# Patient Record
Sex: Female | Born: 1975 | Race: White | Hispanic: No | Marital: Married | Smoking: Never smoker
Health system: Southern US, Community
[De-identification: ages and names within clinical notes are randomized; demographics above are authoritative.]

## PROBLEM LIST (undated history)

## (undated) DIAGNOSIS — F32A Depression, unspecified: Secondary | ICD-10-CM

## (undated) DIAGNOSIS — B948 Sequelae of other specified infectious and parasitic diseases: Secondary | ICD-10-CM

## (undated) DIAGNOSIS — N189 Chronic kidney disease, unspecified: Secondary | ICD-10-CM

## (undated) DIAGNOSIS — G629 Polyneuropathy, unspecified: Secondary | ICD-10-CM

## (undated) DIAGNOSIS — G8929 Other chronic pain: Secondary | ICD-10-CM

## (undated) DIAGNOSIS — T8859XA Other complications of anesthesia, initial encounter: Secondary | ICD-10-CM

## (undated) DIAGNOSIS — F329 Major depressive disorder, single episode, unspecified: Secondary | ICD-10-CM

## (undated) DIAGNOSIS — R51 Headache: Secondary | ICD-10-CM

## (undated) DIAGNOSIS — T4145XA Adverse effect of unspecified anesthetic, initial encounter: Secondary | ICD-10-CM

## (undated) DIAGNOSIS — F419 Anxiety disorder, unspecified: Secondary | ICD-10-CM

## (undated) DIAGNOSIS — K5900 Constipation, unspecified: Secondary | ICD-10-CM

## (undated) DIAGNOSIS — R519 Headache, unspecified: Secondary | ICD-10-CM

## (undated) DIAGNOSIS — K219 Gastro-esophageal reflux disease without esophagitis: Secondary | ICD-10-CM

## (undated) DIAGNOSIS — M1990 Unspecified osteoarthritis, unspecified site: Secondary | ICD-10-CM

## (undated) DIAGNOSIS — K861 Other chronic pancreatitis: Secondary | ICD-10-CM

## (undated) DIAGNOSIS — N200 Calculus of kidney: Secondary | ICD-10-CM

## (undated) DIAGNOSIS — A449 Bartonellosis, unspecified: Secondary | ICD-10-CM

## (undated) DIAGNOSIS — N12 Tubulo-interstitial nephritis, not specified as acute or chronic: Secondary | ICD-10-CM

## (undated) DIAGNOSIS — I10 Essential (primary) hypertension: Secondary | ICD-10-CM

## (undated) DIAGNOSIS — K623 Rectal prolapse: Secondary | ICD-10-CM

## (undated) HISTORY — DX: Unspecified osteoarthritis, unspecified site: M19.90

## (undated) HISTORY — DX: Constipation, unspecified: K59.00

## (undated) HISTORY — DX: Chronic kidney disease, unspecified: N18.9

## (undated) HISTORY — DX: Bartonellosis, unspecified: A44.9

## (undated) HISTORY — DX: Sequelae of other specified infectious and parasitic diseases: B94.8

## (undated) HISTORY — DX: Depression, unspecified: F32.A

## (undated) HISTORY — DX: Calculus of kidney: N20.0

## (undated) HISTORY — DX: Major depressive disorder, single episode, unspecified: F32.9

## (undated) HISTORY — DX: Polyneuropathy, unspecified: G62.9

## (undated) HISTORY — DX: Gastro-esophageal reflux disease without esophagitis: K21.9

## (undated) HISTORY — DX: Rectal prolapse: K62.3

## (undated) HISTORY — DX: Other chronic pain: G89.29

## (undated) HISTORY — DX: Anxiety disorder, unspecified: F41.9

## (undated) HISTORY — DX: Tubulo-interstitial nephritis, not specified as acute or chronic: N12

## (undated) HISTORY — DX: Other chronic pancreatitis: K86.1

---

## 1996-11-05 HISTORY — PX: COLONOSCOPY: SHX174

## 1996-11-05 HISTORY — PX: KNEE SURGERY: SHX244

## 2003-11-06 HISTORY — PX: ESOPHAGOGASTRODUODENOSCOPY ENDOSCOPY: SHX5814

## 2003-11-06 HISTORY — PX: CHOLECYSTECTOMY: SHX55

## 2003-11-06 HISTORY — PX: PORTACATH PLACEMENT: SHX2246

## 2005-11-05 HISTORY — PX: CYSTOSCOPY W/ SPINCTEROTOMY: SUR378

## 2006-05-18 DIAGNOSIS — K834 Spasm of sphincter of Oddi: Secondary | ICD-10-CM | POA: Insufficient documentation

## 2007-11-06 HISTORY — PX: SPINAL CORD STIMULATOR INSERTION: SHX5378

## 2011-04-18 ENCOUNTER — Ambulatory Visit: Payer: Self-pay | Admitting: Family Medicine

## 2011-11-08 DIAGNOSIS — F32A Depression, unspecified: Secondary | ICD-10-CM | POA: Insufficient documentation

## 2011-11-08 DIAGNOSIS — F419 Anxiety disorder, unspecified: Secondary | ICD-10-CM | POA: Insufficient documentation

## 2011-11-08 DIAGNOSIS — F329 Major depressive disorder, single episode, unspecified: Secondary | ICD-10-CM | POA: Insufficient documentation

## 2012-01-07 DIAGNOSIS — A6920 Lyme disease, unspecified: Secondary | ICD-10-CM | POA: Insufficient documentation

## 2012-01-07 DIAGNOSIS — E559 Vitamin D deficiency, unspecified: Secondary | ICD-10-CM | POA: Insufficient documentation

## 2012-12-26 ENCOUNTER — Emergency Department: Payer: Self-pay | Admitting: Unknown Physician Specialty

## 2012-12-27 LAB — CK TOTAL AND CKMB (NOT AT ARMC)
CK, Total: 49 U/L (ref 21–215)
CK-MB: <0.5 ng/mL — ABNORMAL LOW (ref 0.5–3.6)

## 2012-12-27 LAB — TROPONIN I: Troponin-I: <0.02 ng/mL

## 2012-12-27 LAB — CBC
HCT: 45.2 % (ref 35.0–47.0)
HGB: 15.1 g/dL (ref 12.0–16.0)
MCH: 27.1 pg (ref 26.0–34.0)
MCHC: 33.3 g/dL (ref 32.0–36.0)
MCV: 81 fL (ref 80–100)
RDW: 13.3 % (ref 11.5–14.5)

## 2012-12-27 LAB — COMPREHENSIVE METABOLIC PANEL
Albumin: 3.5 g/dL (ref 3.4–5.0)
Anion Gap: 13 (ref 7–16)
Calcium, Total: 8.6 mg/dL (ref 8.5–10.1)
EGFR (Non-African Amer.): >60
Osmolality: 267 (ref 275–301)
SGOT(AST): 86 U/L — ABNORMAL HIGH (ref 15–37)
SGPT (ALT): 84 U/L — ABNORMAL HIGH (ref 12–78)
Sodium: 134 mmol/L — ABNORMAL LOW (ref 136–145)

## 2012-12-29 ENCOUNTER — Emergency Department: Payer: Self-pay | Admitting: Emergency Medicine

## 2012-12-29 LAB — CBC
HGB: 15.1 g/dL (ref 12.0–16.0)
MCHC: 33.8 g/dL (ref 32.0–36.0)
MCV: 82 fL (ref 80–100)
RDW: 13.2 % (ref 11.5–14.5)
WBC: 8.8 10*3/uL (ref 3.6–11.0)

## 2012-12-29 LAB — COMPREHENSIVE METABOLIC PANEL
Alkaline Phosphatase: 126 U/L (ref 50–136)
Anion Gap: 7 (ref 7–16)
Bilirubin,Total: 0.4 mg/dL (ref 0.2–1.0)
Creatinine: 0.71 mg/dL (ref 0.60–1.30)
EGFR (African American): >60
Osmolality: 276 (ref 275–301)
Potassium: 3.8 mmol/L (ref 3.5–5.1)
SGOT(AST): 53 U/L — ABNORMAL HIGH (ref 15–37)
SGPT (ALT): 116 U/L — ABNORMAL HIGH (ref 12–78)
Sodium: 139 mmol/L (ref 136–145)

## 2012-12-30 LAB — URINALYSIS, COMPLETE
Bilirubin,UR: NEGATIVE
Blood: NEGATIVE
Ketone: NEGATIVE
Ph: 7 (ref 4.5–8.0)
Protein: NEGATIVE
RBC,UR: 4 /HPF (ref 0–5)
Specific Gravity: 1.008 (ref 1.003–1.030)
WBC UR: 1 /HPF (ref 0–5)

## 2013-03-12 DIAGNOSIS — IMO0002 Reserved for concepts with insufficient information to code with codable children: Secondary | ICD-10-CM | POA: Insufficient documentation

## 2013-10-20 DIAGNOSIS — M2550 Pain in unspecified joint: Secondary | ICD-10-CM | POA: Insufficient documentation

## 2014-05-28 DIAGNOSIS — B949 Sequelae of unspecified infectious and parasitic disease: Secondary | ICD-10-CM | POA: Insufficient documentation

## 2014-05-28 DIAGNOSIS — A449 Bartonellosis, unspecified: Secondary | ICD-10-CM | POA: Insufficient documentation

## 2014-05-28 DIAGNOSIS — Z87440 Personal history of urinary (tract) infections: Secondary | ICD-10-CM | POA: Insufficient documentation

## 2014-05-28 DIAGNOSIS — G609 Hereditary and idiopathic neuropathy, unspecified: Secondary | ICD-10-CM | POA: Insufficient documentation

## 2014-07-14 DIAGNOSIS — R1013 Epigastric pain: Secondary | ICD-10-CM | POA: Insufficient documentation

## 2016-03-14 ENCOUNTER — Other Ambulatory Visit: Payer: Self-pay

## 2016-03-14 DIAGNOSIS — K623 Rectal prolapse: Secondary | ICD-10-CM | POA: Insufficient documentation

## 2016-03-14 DIAGNOSIS — B948 Sequelae of other specified infectious and parasitic diseases: Secondary | ICD-10-CM | POA: Insufficient documentation

## 2016-03-14 DIAGNOSIS — M8909 Algoneurodystrophy, multiple sites: Secondary | ICD-10-CM | POA: Insufficient documentation

## 2016-03-14 DIAGNOSIS — K5909 Other constipation: Secondary | ICD-10-CM | POA: Insufficient documentation

## 2016-03-14 DIAGNOSIS — N200 Calculus of kidney: Secondary | ICD-10-CM | POA: Insufficient documentation

## 2016-03-14 DIAGNOSIS — K861 Other chronic pancreatitis: Secondary | ICD-10-CM | POA: Insufficient documentation

## 2016-03-14 DIAGNOSIS — M069 Rheumatoid arthritis, unspecified: Secondary | ICD-10-CM | POA: Insufficient documentation

## 2016-03-14 DIAGNOSIS — G5640 Causalgia of unspecified upper limb: Secondary | ICD-10-CM | POA: Insufficient documentation

## 2016-03-14 DIAGNOSIS — G8929 Other chronic pain: Secondary | ICD-10-CM | POA: Insufficient documentation

## 2016-03-14 DIAGNOSIS — Z87440 Personal history of urinary (tract) infections: Secondary | ICD-10-CM | POA: Insufficient documentation

## 2016-03-14 DIAGNOSIS — G629 Polyneuropathy, unspecified: Secondary | ICD-10-CM | POA: Insufficient documentation

## 2016-03-15 ENCOUNTER — Encounter: Payer: Self-pay | Admitting: Gastroenterology

## 2016-03-15 ENCOUNTER — Ambulatory Visit (INDEPENDENT_AMBULATORY_CARE_PROVIDER_SITE_OTHER): Payer: BLUE CROSS/BLUE SHIELD | Admitting: Gastroenterology

## 2016-03-15 VITALS — BP 155/99 | HR 112 | Temp 98.7°F | Ht 67.0 in | Wt 157.0 lb

## 2016-03-15 DIAGNOSIS — K5909 Other constipation: Secondary | ICD-10-CM

## 2016-03-15 DIAGNOSIS — R140 Abdominal distension (gaseous): Secondary | ICD-10-CM | POA: Diagnosis not present

## 2016-03-15 DIAGNOSIS — G43A0 Cyclical vomiting, not intractable: Secondary | ICD-10-CM

## 2016-03-15 DIAGNOSIS — R1115 Cyclical vomiting syndrome unrelated to migraine: Secondary | ICD-10-CM

## 2016-03-15 NOTE — Progress Notes (Signed)
Gastroenterology Consultation  Referring Provider:     No ref. provider found Primary Care Physician:  No PCP Per Patient Primary Gastroenterologist:  Dr. Servando Snare     Reason for Consultation:     Abdominal pain and constipation        HPI:   Tina Donovan is a 40 y.o. y/o female referred for consultation & management of  Abdominal pain and constipation by Dr. Bonnetta Barry PCP Per Patient.   This patient comes today with a history of constipation. The patient also has an extensive past medical history including RDS and sphincter of Oddi dysfunction. The patient has been seen and treated at the [facility] in [place], [facility] in [place] and she has been seen in [place] at [facility]. The patient now comes to me because she states that she was told at [facility] that all that could be done for her constipation was to increase her Miralax. The patient also reports that she has recurrent hiccups. She also has chronic nausea. The patient takes chronic pain medications. In addition to her constipation the patient reports that she has a lot of abdominal bloating. She also has had a rectoceal in the past and she thinks it has returned. The patient also has rectal bleeding when she has her rectal prolapse.  Past Medical History  Diagnosis Date  . GERD (gastroesophageal reflux disease)   . Anxiety   . Depression   . Chronic kidney disease   . Post-Lyme disease syndrome   . Bartonella infection   . Small fiber neuropathy (HCC)   . Chronic pancreatitis (HCC)   . Arthritis     Rheumatoid  . Chronic pain   . Rectal prolapse   . Constipation   . Kidney stone   . Pyelonephritis     Past Surgical History  Procedure Laterality Date  . Knee surgery  1998  . Cholecystectomy  2005    [facility]  . Cystoscopy w/ spincterotomy  2007  . Portacath placement  2005  . Spinal cord stimulator insertion  2009  . Colonoscopy  1998  . Esophagogastroduodenoscopy endoscopy  2005    2007, 2009 and 2016    Prior  to Admission medications   Medication Sig Start Date End Date Taking? Authorizing Provider  ALPRAZolam Prudy Feeler) 0.5 MG tablet  08/06/15  Yes Historical Provider, MD  ALPRAZolam Prudy Feeler) 1 MG tablet 1 tablet. 03/11/16  Yes Historical Provider, MD  aspirin 325 MG tablet Take by mouth.   Yes Historical Provider, MD  aspirin-acetaminophen-caffeine (EXCEDRIN MIGRAINE) [phone] MG tablet Take by mouth.   Yes Historical Provider, MD  diazepam (VALIUM) 5 MG tablet Take 5 mg by mouth.   Yes Historical Provider, MD  HYDROmorphone (DILAUDID) 8 MG tablet Take 8 mg by mouth. 03/10/15  Yes Historical Provider, MD  lidocaine (LIDODERM) 5 %  09/07/10  Yes Historical Provider, MD  LORazepam (ATIVAN) 0.5 MG tablet  09/07/15  Yes Historical Provider, MD  morphine (MS CONTIN) 100 MG 12 hr tablet Take 60 mg by mouth.   Yes Historical Provider, MD  morphine (MS CONTIN) 60 MG 12 hr tablet Take 1 tablet by mouth. 02/21/16  Yes Historical Provider, MD  nitroGLYCERIN (NITROSTAT) 0.3 MG SL tablet 0.3 mg. 01/07/12  Yes Historical Provider, MD  Oxycodone HCl 20 MG TABS Take 1 tablet by mouth. 03/04/16  Yes Historical Provider, MD  PREMARIN 0.3 MG tablet 1 tablet. 02/06/16  Yes Historical Provider, MD  promethazine (PHENERGAN) 25 MG tablet Take 25  mg by mouth 2 (two) times daily as needed. 01/10/16  Yes Historical Provider, MD  propranolol ER (INDERAL LA) 60 MG 24 hr capsule  09/11/15  Yes Historical Provider, MD  tiZANidine (ZANAFLEX) 4 MG tablet Take 4-8 mg by mouth. 12/28/14  Yes Historical Provider, MD  traZODone (DESYREL) 150 MG tablet Take 150 mg by mouth. 03/15/14  Yes Historical Provider, MD  traZODone (DESYREL) 50 MG tablet  09/11/10  Yes Historical Provider, MD  zolpidem (AMBIEN) 10 MG tablet  06/21/15  Yes Historical Provider, MD    Family History  Problem Relation Age of Onset  . Alcohol abuse Father   . Heart disease Father   . Dementia Father   . Cancer Mother     breast and uterine     Social History  Substance Use  Topics  . Smoking status: Never Smoker   . Smokeless tobacco: None  . Alcohol Use: No    Allergies as of 03/15/2016 - Review Complete 03/15/2016  Allergen Reaction Noted  . Duloxetine Other (See Comments) 03/14/2016  . Levetiracetam Other (See Comments) 03/14/2016  . Levofloxacin Other (See Comments) 03/14/2016  . Other  03/14/2016    Review of Systems:    All systems reviewed and negative except where noted in HPI.   Physical Exam:  BP 155/99 mmHg  Pulse 112  Temp(Src) 98.7 F (37.1 C) (Oral)  Ht 5\' 7"  (1.702 m)  Wt 157 lb (71.215 kg)  BMI 24.58 kg/m2 No LMP recorded. Psych:  Alert and cooperative. Normal mood and affect. General:   Alert,  Well-developed, well-nourished, pleasant and cooperative in NAD Head:  Normocephalic and atraumatic. Eyes:  Sclera clear, no icterus.   Conjunctiva pink. Ears:  Normal auditory acuity. Nose:  No deformity, discharge, or lesions. Mouth:  No deformity or lesions,oropharynx pink & moist. Neck:  Supple; no masses or thyromegaly. Lungs:  Respirations even and unlabored.  Clear throughout to auscultation.   No wheezes, crackles, or rhonchi. No acute distress. Heart:  Regular rate and rhythm; no murmurs, clicks, rubs, or gallops. Abdomen:  Normal bowel sounds.  No bruits.  Soft, non-tender and non-distended without masses, hepatosplenomegaly or hernias noted.  No guarding or rebound tenderness.  Negative Carnett sign.   Rectal:  Deferred.  Msk:  Symmetrical without gross deformities.  Good, equal movement & strength bilaterally. Pulses:  Normal pulses noted. Extremities:  No clubbing or edema.  No cyanosis. Neurologic:  Alert and oriented x3;  grossly normal neurologically. Skin:  Intact without significant lesions or rashes.  No jaundice. Lymph Nodes:  No significant cervical adenopathy. Psych:  Alert and cooperative. Normal mood and affect.  Imaging Studies: No results found.  Assessment and Plan:   Tina Donovan is a 40 y.o. y/o  female  Who comes in today with multiple G.I. problems including the recurrent hiccups. The patient also has nausea with constipation and bloating.  The patient will be  Started on a trial of Dexilant and Linzess. After she tries these medications she will let me know whether her symptoms have improved. The patient has been explained the plan and agrees with it.   Note: This dictation was prepared with Dragon dictation along with smaller phrase technology. Any transcriptional errors that result from this process are unintentional.

## 2016-03-20 ENCOUNTER — Telehealth: Payer: Self-pay | Admitting: Gastroenterology

## 2016-03-20 NOTE — Telephone Encounter (Signed)
Patient called and wanted to know if she needs to come back to see Dr. Servando Snare? She also wanted to know if he could recommend a PCp and an internal doctor for her?

## 2016-03-21 NOTE — Telephone Encounter (Signed)
LVM for pt to return my call.

## 2016-03-22 NOTE — Telephone Encounter (Signed)
Pt called today and stating you had discussed during her ov last week the possibility of yeast growth in her GI tract. She was wondering how we would go about finding this out. Please advise.

## 2016-03-26 NOTE — Telephone Encounter (Signed)
The patient noted is not easy to find whether he uses growing the small bowel or not. It may be picked up on a stool test and that can be ordered if the patient is agreeable.

## 2016-03-28 NOTE — Telephone Encounter (Signed)
LVM for pt to return my call.

## 2016-04-13 ENCOUNTER — Other Ambulatory Visit: Payer: Self-pay

## 2016-04-13 DIAGNOSIS — K2100 Gastro-esophageal reflux disease with esophagitis, without bleeding: Secondary | ICD-10-CM

## 2016-04-13 DIAGNOSIS — K5900 Constipation, unspecified: Secondary | ICD-10-CM

## 2016-04-13 MED ORDER — DEXLANSOPRAZOLE 60 MG PO CPDR: 60.0000 mg | DELAYED_RELEASE_CAPSULE | Freq: Every day | ORAL | Status: DC

## 2016-04-13 MED ORDER — LINACLOTIDE 290 MCG PO CAPS: 290.0000 ug | ORAL_CAPSULE | Freq: Every day | ORAL | Status: DC

## 2016-04-30 ENCOUNTER — Other Ambulatory Visit: Payer: Self-pay

## 2016-05-11 ENCOUNTER — Telehealth: Payer: Self-pay | Admitting: Gastroenterology

## 2016-05-11 ENCOUNTER — Other Ambulatory Visit: Payer: Self-pay

## 2016-05-11 MED ORDER — PEG 3350-KCL-NABCB-NACL-NASULF 236 G PO SOLR: 4000.0000 mL | Freq: Once | ORAL | Status: DC

## 2016-05-11 NOTE — Telephone Encounter (Signed)
Contacted pt and she stated the Linzess 290mg  is not working like it was before. She started taking this in April of this year. Pt confirmed she was doing this every day as she has a lot of issues and needs this medication. She said Dr. May had told her not to add Miralax as she had been taking this for years. Pt asked if I could send a rx for Golytely to her pharmacy to help with the constipation and possibly order a KUB to see if she is in fact constipated with stool. Rx sent to pharmacy and she will discuss with her family their thoughts on the xray and will call me back if she decides she would like the imaging.

## 2016-05-11 NOTE — Telephone Encounter (Signed)
Patients husband called and stated that the Linzess is not working as well as before. Should they add something else? Does she need an appointment?

## 2016-05-25 ENCOUNTER — Other Ambulatory Visit: Payer: Self-pay

## 2016-05-25 ENCOUNTER — Other Ambulatory Visit
Admission: RE | Admit: 2016-05-25 | Payer: BLUE CROSS/BLUE SHIELD | Source: Ambulatory Visit | Admitting: Gastroenterology

## 2016-05-25 ENCOUNTER — Ambulatory Visit
Admission: RE | Admit: 2016-05-25 | Discharge: 2016-05-25 | Disposition: A | Discharge status: Home / Self Care | Payer: BLUE CROSS/BLUE SHIELD | Source: Ambulatory Visit | Attending: Gastroenterology | Admitting: Gastroenterology

## 2016-05-25 DIAGNOSIS — Z95828 Presence of other vascular implants and grafts: Secondary | ICD-10-CM | POA: Diagnosis not present

## 2016-05-25 DIAGNOSIS — K5909 Other constipation: Secondary | ICD-10-CM

## 2016-05-25 DIAGNOSIS — Z9889 Other specified postprocedural states: Secondary | ICD-10-CM | POA: Insufficient documentation

## 2016-05-25 DIAGNOSIS — K5900 Constipation, unspecified: Secondary | ICD-10-CM | POA: Insufficient documentation

## 2016-05-25 DIAGNOSIS — Z01818 Encounter for other preprocedural examination: Secondary | ICD-10-CM

## 2016-05-25 NOTE — Telephone Encounter (Signed)
Please review xray that was order because pt was worried about a stricture and wanted to know how much stool she had. Pt stated the Linzess just isn't working and her stools are really hard. I had mentioned to her we have the new medication Trulance. Please advise if I can give her some samples to try.

## 2016-05-28 NOTE — Telephone Encounter (Signed)
Let the patient know that her x-ray did not show any strictures or narrowing area. The patient had stated that I told her she should not take MiraLAX when in fact that is not the case. She can add MiraLAX and/or be switched to Trulance. The x-ray did show her to have a large amount of stool in her colon. As far as any lumps or bumps that she reports in her lower abdomen, that may need to be followed up with surgery and the report of changes in her vaginal wall should be followed up with gynecology.

## 2016-05-29 NOTE — Telephone Encounter (Signed)
LVM for pt to return my call.

## 2016-05-31 NOTE — Telephone Encounter (Signed)
Pt notified to restart Miralax with her Linzess. Pt will keep appt on 06/06/16 to discuss constipation. If adding Miralax back to regimen improves her constipation, she will cancel her appt.

## 2016-06-01 ENCOUNTER — Telehealth: Payer: Self-pay | Admitting: Gastroenterology

## 2016-06-01 NOTE — Telephone Encounter (Signed)
Patient would like to talk to you but didn't say why.

## 2016-06-01 NOTE — Telephone Encounter (Signed)
Pt just wanted me to know she reread her paperwork and it was the enemas that Dr. Servando Snare told her to do not the Miralax. Pt restarted the Miralax last night. Will let me know if she needs anything else.

## 2016-06-06 ENCOUNTER — Encounter: Payer: Self-pay | Admitting: Gastroenterology

## 2016-06-06 ENCOUNTER — Ambulatory Visit (INDEPENDENT_AMBULATORY_CARE_PROVIDER_SITE_OTHER): Payer: BLUE CROSS/BLUE SHIELD | Admitting: Gastroenterology

## 2016-06-06 VITALS — BP 151/92 | HR 94 | Temp 98.9°F | Ht 67.0 in | Wt 157.0 lb

## 2016-06-06 DIAGNOSIS — K5901 Slow transit constipation: Secondary | ICD-10-CM | POA: Diagnosis not present

## 2016-06-07 NOTE — Progress Notes (Signed)
Primary Care Physician: No PCP Per Patient  Primary Gastroenterologist:  Dr. Midge Minium  Chief Complaint  Patient presents with  . Follow up constipation  . Gastroesophageal Reflux    HPI: Tina Donovan is a 40 y.o. female here for continued constipation. The patient has a complicated past medical history including narcotic use and intestinal hypomotility with a history of being seen at the [facility] and at [facility]. The patient now comes in with continued constipation despite taking 290 g of Linzess and MiraLAX twice a day. The patient also comes in with a video of her stools showing how hard they are. The patient had previously called thinking that she was obstructed from her constipation and had a KUB that showed a large stool burden without any sign of obstruction.  Current Outpatient Prescriptions  Medication Sig Dispense Refill  . ALPRAZolam (XANAX) 1 MG tablet 1 tablet.  0  . aspirin 325 MG tablet Take by mouth.    Marland Kitchen aspirin-acetaminophen-caffeine (EXCEDRIN MIGRAINE) 250-250-65 MG tablet Take by mouth.    . dexlansoprazole (DEXILANT) 60 MG capsule Take 1 capsule (60 mg total) by mouth daily. 30 capsule 11  . diazepam (VALIUM) 5 MG tablet Take 5 mg by mouth.    Marland Kitchen HYDROmorphone (DILAUDID) 8 MG tablet Take 8 mg by mouth.    . linaclotide (LINZESS) 290 MCG CAPS capsule Take 1 capsule (290 mcg total) by mouth daily before breakfast. 30 capsule 11  . morphine (MS CONTIN) 60 MG 12 hr tablet Take 1 tablet by mouth.  0  . nitroGLYCERIN (NITROSTAT) 0.3 MG SL tablet 0.3 mg.    . Oxycodone HCl 20 MG TABS Take 1 tablet by mouth.  0  . promethazine (PHENERGAN) 25 MG tablet Take 25 mg by mouth 2 (two) times daily as needed.  4  . propranolol ER (INDERAL LA) 60 MG 24 hr capsule     . tiZANidine (ZANAFLEX) 4 MG tablet Take 4-8 mg by mouth.    . traZODone (DESYREL) 50 MG tablet     . lidocaine (LIDODERM) 5 %     . LORazepam (ATIVAN) 0.5 MG tablet     . PREMARIN 0.3 MG tablet 1 tablet.   0  . traZODone (DESYREL) 150 MG tablet Take 150 mg by mouth.    . zolpidem (AMBIEN) 10 MG tablet      No current facility-administered medications for this visit.     Allergies as of 06/06/2016 - Review Complete 06/06/2016  Allergen Reaction Noted  . Duloxetine Other (See Comments) 03/14/2016  . Levetiracetam Other (See Comments) 03/14/2016  . Levofloxacin Other (See Comments) 03/14/2016  . Other  03/14/2016    ROS:  General: Negative for anorexia, weight loss, fever, chills, fatigue, weakness. ENT: Negative for hoarseness, difficulty swallowing , nasal congestion. CV: Negative for chest pain, angina, palpitations, dyspnea on exertion, peripheral edema.  Respiratory: Negative for dyspnea at rest, dyspnea on exertion, cough, sputum, wheezing.  GI: See history of present illness. GU:  Negative for dysuria, hematuria, urinary incontinence, urinary frequency, nocturnal urination.  Endo: Negative for unusual weight change.    Physical Examination:   BP (!) 151/92   Pulse 94   Temp 98.9 F (37.2 C) (Oral)   Ht 5\' 7"  (1.702 m)   Wt 157 lb (71.2 kg)   LMP 04/16/2016   BMI 24.59 kg/m   General: Well-nourished, well-developed in no acute distress.  Eyes: No icterus. Conjunctivae pink. Neuro: Alert and oriented x 3.  Grossly  intact. Skin: Warm and dry, no jaundice.   Psych: Alert and cooperative, normal mood and affect.  Labs:    Imaging Studies: Dg Abd 1 View  Result Date: 05/25/2016 CLINICAL DATA:  Post Lyme disease syndrome, chronic pancreatitis, chronic constipation. EXAM: ABDOMEN - 1 VIEW COMPARISON:  None in PACs FINDINGS: The colonic and rectal stool burden is increased. There is no small or large bowel obstructive pattern. There are no abnormal soft tissue calcifications. An IUD is present. Nerve stimulator electrodes are present at T12-L1 and L2-L3. A Port-A-Cath appliance catheter is faintly visible projecting over the cavoatrial junction. A second central venous  catheter lies just proximal to this. IMPRESSION: 1. Increase colonic and rectal stool burden consistent with clinical constipation. No evidence of small or large bowel obstruction. 2. Neurostimulator electrodes present as described. 3. Somewhat low positioning of a porta catheter catheter tip at the level of the cavoatrial junction. Correlation as to the appropriateness of this positioning is needed. Electronically Signed   By: David  [place] M.D.   On: 05/25/2016 10:40    Assessment and Plan:   Tina Donovan is a 40 y.o. y/o female with chronic constipation and decreased colonic motility. In addition to this she reports that she has had decrease intestinal motility diagnosed the past. The patient has been told to continue her present medications and add fiber and/or Dulcolax. The patient also has a 4 L jug of Colyte. The patient has been told to slowly set this throughout the day to help clean her out.  Over 45 minutes of the counter was used to counseled the patient.   Note: This dictation was prepared with Dragon dictation along with smaller phrase technology. Any transcriptional errors that result from this process are unintentional.

## 2016-07-18 ENCOUNTER — Ambulatory Visit (INDEPENDENT_AMBULATORY_CARE_PROVIDER_SITE_OTHER): Payer: BLUE CROSS/BLUE SHIELD | Admitting: Gastroenterology

## 2016-07-18 ENCOUNTER — Encounter: Payer: Self-pay | Admitting: Gastroenterology

## 2016-07-18 ENCOUNTER — Other Ambulatory Visit: Payer: Self-pay

## 2016-07-18 VITALS — BP 124/87 | HR 103 | Temp 98.6°F | Ht 67.0 in | Wt 148.0 lb

## 2016-07-18 DIAGNOSIS — K5901 Slow transit constipation: Secondary | ICD-10-CM

## 2016-07-18 DIAGNOSIS — Z01818 Encounter for other preprocedural examination: Secondary | ICD-10-CM

## 2016-07-18 DIAGNOSIS — R1084 Generalized abdominal pain: Secondary | ICD-10-CM

## 2016-07-18 NOTE — Patient Instructions (Addendum)
You are scheduled for CT scan Abdomen/Pelvis at [facility] location on Monday, Sept 18th @ 10:00am. Please arrive at 9:45am. You are to be NPO 4 hours prior to scan. Please pick up contrast media from Radiology today.

## 2016-07-19 NOTE — Progress Notes (Signed)
Primary Care Physician: No PCP Per Patient  Primary Gastroenterologist:  Dr. Midge Minium  Chief Complaint  Patient presents with  . Follow up constipation  . Gastroesophageal Reflux    HPI: Tina Donovan is a 40 y.o. female here for follow-up of her multiple GI problems. The patient states that she is now moving her bowels and despite showing me a video the last time she was  In my office of her banging very hard stools on the table she now comes with pictures of her daily bowel movements for the last week.  She reports that she is now having frequent large volume bowel movements daily.  She  Reports that she still has bandlike pain in the upper abdomen.  She comes with a list today  With multiple questions about whether she needs to be treated for a yeast infection or be tested for it.  She thinks she may have yeast in her stool.  The patient also wants to know if she needs a repeat CT scan and she has heard about a capsule endoscopy and is wondering why she hasn't had that done.   The patient is presently on probiotics and multiple laxatives to help her move her bowels.  She also comes with a recording from a eeting in [place] in about whether the medication she is taking may not work because she has red hair and people with red hair need more anesthesia Suggesting that she may be metabolizing all of her medications quickly.  Current Outpatient Prescriptions  Medication Sig Dispense Refill  . ALPRAZolam (XANAX) 1 MG tablet 1 tablet.  0  . aspirin 325 MG tablet Take by mouth.    Marland Kitchen aspirin-acetaminophen-caffeine (EXCEDRIN MIGRAINE) 250-250-65 MG tablet Take by mouth.    . dexlansoprazole (DEXILANT) 60 MG capsule Take 1 capsule (60 mg total) by mouth daily. 30 capsule 11  . diazepam (VALIUM) 5 MG tablet Take 5 mg by mouth.    Marland Kitchen HYDROmorphone (DILAUDID) 8 MG tablet Take 8 mg by mouth.    . lidocaine (LIDODERM) 5 %     . linaclotide (LINZESS) 290 MCG CAPS capsule Take 1 capsule (290 mcg  total) by mouth daily before breakfast. 30 capsule 11  . LORazepam (ATIVAN) 0.5 MG tablet     . morphine (MS CONTIN) 60 MG 12 hr tablet Take 1 tablet by mouth.  0  . nitroGLYCERIN (NITROSTAT) 0.3 MG SL tablet 0.3 mg.    . ondansetron (ZOFRAN) 8 MG tablet TAKE 1 TABLET UP TO 3 TIMES DAILY AS NEEDED  5  . Oxycodone HCl 20 MG TABS Take 1 tablet by mouth.  0  . PREMARIN 0.3 MG tablet 1 tablet.  0  . promethazine (PHENERGAN) 25 MG tablet Take 25 mg by mouth 2 (two) times daily as needed.  4  . propranolol ER (INDERAL LA) 60 MG 24 hr capsule     . tiZANidine (ZANAFLEX) 4 MG tablet Take 4-8 mg by mouth.    . traZODone (DESYREL) 150 MG tablet Take 150 mg by mouth.    . traZODone (DESYREL) 50 MG tablet     . zolpidem (AMBIEN) 10 MG tablet      No current facility-administered medications for this visit.     Allergies as of 07/18/2016 - Review Complete 07/18/2016  Allergen Reaction Noted  . Duloxetine Other (See Comments) 03/14/2016  . Levetiracetam Other (See Comments) 03/14/2016  . Levofloxacin Other (See Comments) 03/14/2016  . Other  03/14/2016  ROS:  General: Negative for anorexia, weight loss, fever, chills, fatigue, weakness. ENT: Negative for hoarseness, difficulty swallowing , nasal congestion. CV: Negative for chest pain, angina, palpitations, dyspnea on exertion, peripheral edema.  Respiratory: Negative for dyspnea at rest, dyspnea on exertion, cough, sputum, wheezing.  GI: See history of present illness. GU:  Negative for dysuria, hematuria, urinary incontinence, urinary frequency, nocturnal urination.  Endo: Negative for unusual weight change.    Physical Examination:   BP 124/87   Pulse (!) 103   Temp 98.6 F (37 C) (Oral)   Ht 5\' 7"  (1.702 m)   Wt 148 lb (67.1 kg)   BMI 23.18 kg/m   General: Well-nourished, well-developed in no acute distress.  Eyes: No icterus. Conjunctivae pink. Mouth: Oropharyngeal mucosa moist and pink , no lesions erythema or  exudate. Lungs: Clear to auscultation bilaterally. Non-labored. Heart: Regular rate and rhythm, no murmurs rubs or gallops.  Abdomen: Bowel sounds are normal, nontender, nondistended, no hepatosplenomegaly or masses, no abdominal bruits or hernia , no rebound or guarding.   Extremities: No lower extremity edema. No clubbing or deformities. Neuro: Alert and oriented x 3.  Grossly intact. Skin: Warm and dry, no jaundice.   Psych: Alert and cooperative, normal mood and affect.  Labs:    Imaging Studies: No results found.  Assessment and Plan:   Tina Donovan is a 40 y.o. y/o female with multiple GI  Problems.The patient had been set up for a CT scan of her abdomen due to continued abdominal pain. She has been told that a capsule endoscopy will not give 45 any more information then we have at present.  There is no report of any rectal bleeding or signs of small intestinal stricture.   She has also been informed that despite needing more anesthesia because she has red hair this should not affect her MiraLAX,, Linzess and probiotics because these medications are not absorbed and work locally. The patient has been explained the plan and agrees with it.   Note: This dictation was prepared with Dragon dictation along with smaller phrase technology. Any transcriptional errors that result from this process are unintentional.

## 2016-07-20 ENCOUNTER — Other Ambulatory Visit
Admission: RE | Admit: 2016-07-20 | Discharge: 2016-07-20 | Disposition: A | Discharge status: Home / Self Care | Payer: BLUE CROSS/BLUE SHIELD | Source: Ambulatory Visit | Attending: Gastroenterology | Admitting: Gastroenterology

## 2016-07-20 DIAGNOSIS — R1084 Generalized abdominal pain: Secondary | ICD-10-CM | POA: Diagnosis present

## 2016-07-20 DIAGNOSIS — Z0189 Encounter for other specified special examinations: Secondary | ICD-10-CM | POA: Diagnosis present

## 2016-07-20 LAB — CREATININE, SERUM
Creatinine, Ser: 0.74 mg/dL (ref 0.44–1.00)
GFR calc Af Amer: >60 mL/min (ref >60)
GFR calc non Af Amer: >60 mL/min (ref >60)

## 2016-07-20 LAB — PREGNANCY, URINE: Preg Test, Ur: NEGATIVE

## 2016-07-23 ENCOUNTER — Ambulatory Visit
Admission: RE | Admit: 2016-07-23 | Discharge: 2016-07-23 | Disposition: A | Discharge status: Home / Self Care | Payer: BLUE CROSS/BLUE SHIELD | Source: Ambulatory Visit | Attending: Gastroenterology | Admitting: Gastroenterology

## 2016-07-23 DIAGNOSIS — Z975 Presence of (intrauterine) contraceptive device: Secondary | ICD-10-CM | POA: Diagnosis not present

## 2016-07-23 DIAGNOSIS — M47897 Other spondylosis, lumbosacral region: Secondary | ICD-10-CM | POA: Diagnosis not present

## 2016-07-23 DIAGNOSIS — Z992 Dependence on renal dialysis: Secondary | ICD-10-CM | POA: Diagnosis not present

## 2016-07-23 DIAGNOSIS — R1084 Generalized abdominal pain: Secondary | ICD-10-CM | POA: Insufficient documentation

## 2016-07-23 MED ORDER — IOPAMIDOL (ISOVUE-300) INJECTION 61%
100.0000 mL | Freq: Once | INTRAVENOUS | Status: AC | PRN
  Administered 2016-07-23: 100 mL via INTRAVENOUS

## 2016-07-26 ENCOUNTER — Telehealth: Payer: Self-pay | Admitting: Gastroenterology

## 2016-07-26 NOTE — Telephone Encounter (Signed)
Patient is returning your call and also wanted to let you know that she had a CT at [facility] today. Will Dr. Servando Snare look at it and call her. No barium coming out yet.

## 2016-07-27 ENCOUNTER — Telehealth: Payer: Self-pay

## 2016-07-27 NOTE — Telephone Encounter (Signed)
Pt notified of CT scan results.  

## 2016-07-27 NOTE — Telephone Encounter (Signed)
LVM for pt to return my call.

## 2016-07-27 NOTE — Telephone Encounter (Signed)
-----   Message from Midge Minium, MD sent at 07/24/2016 12:25 PM EDT ----- Let the patient know that the CT scan showed her to have some remaining stool but no other GI abnormalities

## 2016-08-01 ENCOUNTER — Encounter: Payer: Self-pay | Admitting: *Deleted

## 2016-08-03 DIAGNOSIS — N83201 Unspecified ovarian cyst, right side: Secondary | ICD-10-CM | POA: Insufficient documentation

## 2016-08-03 NOTE — Discharge Instructions (Signed)

## 2016-08-06 ENCOUNTER — Encounter
Admission: RE | Disposition: A | Discharge status: Home / Self Care | Payer: Self-pay | Source: Ambulatory Visit | Attending: Gastroenterology

## 2016-08-06 ENCOUNTER — Ambulatory Visit
Admission: RE | Admit: 2016-08-06 | Discharge: 2016-08-06 | Disposition: A | Discharge status: Home / Self Care | Payer: BLUE CROSS/BLUE SHIELD | Source: Ambulatory Visit | Attending: Gastroenterology | Admitting: Gastroenterology

## 2016-08-06 ENCOUNTER — Ambulatory Visit: Payer: BLUE CROSS/BLUE SHIELD | Admitting: Anesthesiology

## 2016-08-06 DIAGNOSIS — I129 Hypertensive chronic kidney disease with stage 1 through stage 4 chronic kidney disease, or unspecified chronic kidney disease: Secondary | ICD-10-CM | POA: Diagnosis not present

## 2016-08-06 DIAGNOSIS — R110 Nausea: Secondary | ICD-10-CM | POA: Diagnosis not present

## 2016-08-06 DIAGNOSIS — Z79899 Other long term (current) drug therapy: Secondary | ICD-10-CM | POA: Diagnosis not present

## 2016-08-06 DIAGNOSIS — R1013 Epigastric pain: Secondary | ICD-10-CM

## 2016-08-06 DIAGNOSIS — M069 Rheumatoid arthritis, unspecified: Secondary | ICD-10-CM | POA: Insufficient documentation

## 2016-08-06 DIAGNOSIS — F419 Anxiety disorder, unspecified: Secondary | ICD-10-CM | POA: Diagnosis not present

## 2016-08-06 DIAGNOSIS — N189 Chronic kidney disease, unspecified: Secondary | ICD-10-CM | POA: Diagnosis not present

## 2016-08-06 DIAGNOSIS — Z79891 Long term (current) use of opiate analgesic: Secondary | ICD-10-CM | POA: Diagnosis not present

## 2016-08-06 HISTORY — DX: Essential (primary) hypertension: I10

## 2016-08-06 HISTORY — DX: Headache: R51

## 2016-08-06 HISTORY — DX: Headache, unspecified: R51.9

## 2016-08-06 HISTORY — DX: Adverse effect of unspecified anesthetic, initial encounter: T41.45XA

## 2016-08-06 HISTORY — DX: Other complications of anesthesia, initial encounter: T88.59XA

## 2016-08-06 HISTORY — PX: ESOPHAGOGASTRODUODENOSCOPY (EGD) WITH PROPOFOL: SHX5813

## 2016-08-06 SURGERY — ESOPHAGOGASTRODUODENOSCOPY (EGD) WITH PROPOFOL
Anesthesia: Monitor Anesthesia Care

## 2016-08-06 MED ORDER — LACTATED RINGERS IV SOLN
INTRAVENOUS | Status: DC
  Administered 2016-08-06: 12:00:00 via INTRAVENOUS

## 2016-08-06 MED ORDER — GLYCOPYRROLATE 0.2 MG/ML IJ SOLN
INTRAMUSCULAR | Status: DC | PRN
  Administered 2016-08-06: 0.1 mg via INTRAVENOUS

## 2016-08-06 MED ORDER — PROPOFOL 10 MG/ML IV BOLUS
INTRAVENOUS | Status: DC | PRN
  Administered 2016-08-06: 50 mg via INTRAVENOUS
  Administered 2016-08-06: 150 mg via INTRAVENOUS

## 2016-08-06 MED ORDER — LIDOCAINE HCL (CARDIAC) 20 MG/ML IV SOLN
INTRAVENOUS | Status: DC | PRN
  Administered 2016-08-06: 50 mg via INTRAVENOUS

## 2016-08-06 MED ORDER — ACETAMINOPHEN 325 MG PO TABS: 325.0000 mg | ORAL_TABLET | ORAL | Status: DC | PRN

## 2016-08-06 MED ORDER — ACETAMINOPHEN 160 MG/5ML PO SOLN: 325.0000 mg | ORAL | Status: DC | PRN

## 2016-08-06 SURGICAL SUPPLY — 32 items
BALLN DILATOR 10-12 8 (BALLOONS)
BALLN DILATOR 12-15 8 (BALLOONS)
BALLN DILATOR 15-18 8 (BALLOONS)
BALLN DILATOR CRE 0-12 8 (BALLOONS)
BALLN DILATOR ESOPH 8 10 CRE (MISCELLANEOUS) IMPLANT
BALLOON DILATOR 12-15 8 (BALLOONS) IMPLANT
BALLOON DILATOR 15-18 8 (BALLOONS) IMPLANT
BALLOON DILATOR CRE 0-12 8 (BALLOONS) IMPLANT
BLOCK BITE 60FR ADLT L/F GRN (MISCELLANEOUS) ×2 IMPLANT
CANISTER SUCT 1200ML W/VALVE (MISCELLANEOUS) ×2 IMPLANT
CLIP HMST 235XBRD CATH ROT (MISCELLANEOUS) IMPLANT
CLIP RESOLUTION 360 11X235 (MISCELLANEOUS)
FCP ESCP3.2XJMB 240X2.8X (MISCELLANEOUS)
FORCEPS BIOP RAD 4 LRG CAP 4 (CUTTING FORCEPS) IMPLANT
FORCEPS BIOP RJ4 240 W/NDL (MISCELLANEOUS)
FORCEPS ESCP3.2XJMB 240X2.8X (MISCELLANEOUS) IMPLANT
GOWN CVR UNV OPN BCK APRN NK (MISCELLANEOUS) ×2 IMPLANT
GOWN ISOL THUMB LOOP REG UNIV (MISCELLANEOUS) ×2
INJECTOR VARIJECT VIN23 (MISCELLANEOUS) IMPLANT
KIT DEFENDO VALVE AND CONN (KITS) IMPLANT
KIT ENDO PROCEDURE OLY (KITS) ×2 IMPLANT
MARKER SPOT ENDO TATTOO 5ML (MISCELLANEOUS) IMPLANT
PAD GROUND ADULT SPLIT (MISCELLANEOUS) IMPLANT
RETRIEVER NET PLAT FOOD (MISCELLANEOUS) IMPLANT
SNARE SHORT THROW 13M SML OVAL (MISCELLANEOUS) IMPLANT
SNARE SHORT THROW 30M LRG OVAL (MISCELLANEOUS) IMPLANT
SPOT EX ENDOSCOPIC TATTOO (MISCELLANEOUS)
SYR INFLATION 60ML (SYRINGE) IMPLANT
TRAP ETRAP POLY (MISCELLANEOUS) IMPLANT
VARIJECT INJECTOR VIN23 (MISCELLANEOUS)
WATER STERILE IRR 250ML POUR (IV SOLUTION) ×2 IMPLANT
WIRE CRE 18-20MM 8CM F G (MISCELLANEOUS) IMPLANT

## 2016-08-06 NOTE — Anesthesia Preprocedure Evaluation (Signed)
Anesthesia Evaluation  Patient identified by MRN, date of birth, ID band Patient awake    Reviewed: Allergy & Precautions, H&P , NPO status , Patient's Chart, lab work & pertinent test results  Airway Mallampati: II  TM Distance: >3 FB Neck ROM: full    Dental no notable dental hx.    Pulmonary    Pulmonary exam normal        Cardiovascular hypertension, Normal cardiovascular exam     Neuro/Psych PSYCHIATRIC DISORDERS  Neuromuscular disease    GI/Hepatic GERD  ,  Endo/Other    Renal/GU Renal disease     Musculoskeletal   Abdominal   Peds  Hematology   Anesthesia Other Findings   Reproductive/Obstetrics                             Anesthesia Physical Anesthesia Plan  ASA: II  Anesthesia Plan: MAC   Post-op Pain Management:    Induction:   Airway Management Planned:   Additional Equipment:   Intra-op Plan:   Post-operative Plan:   Informed Consent: I have reviewed the patients History and Physical, chart, labs and discussed the procedure including the risks, benefits and alternatives for the proposed anesthesia with the patient or authorized representative who has indicated his/her understanding and acceptance.     Plan Discussed with:   Anesthesia Plan Comments:         Anesthesia Quick Evaluation

## 2016-08-06 NOTE — Anesthesia Postprocedure Evaluation (Signed)
Anesthesia Post Note  Patient: Tina Donovan  Procedure(s) Performed: Procedure(s) (LRB): ESOPHAGOGASTRODUODENOSCOPY (EGD) WITH PROPOFOL (N/A)  Patient location during evaluation: PACU Anesthesia Type: MAC Level of consciousness: awake and alert and oriented Pain management: satisfactory to patient Vital Signs Assessment: post-procedure vital signs reviewed and stable Respiratory status: spontaneous breathing, nonlabored ventilation and respiratory function stable Cardiovascular status: blood pressure returned to baseline and stable Postop Assessment: Adequate PO intake and No signs of nausea or vomiting Anesthetic complications: no    Cherly [place]

## 2016-08-06 NOTE — Transfer of Care (Signed)
Immediate Anesthesia Transfer of Care Note  Patient: Tina Donovan  Procedure(s) Performed: Procedure(s): ESOPHAGOGASTRODUODENOSCOPY (EGD) WITH PROPOFOL (N/A)  Patient Location: PACU  Anesthesia Type: MAC  Level of Consciousness: awake, alert  and patient cooperative  Airway and Oxygen Therapy: Patient Spontanous Breathing and Patient connected to supplemental oxygen  Post-op Assessment: Post-op Vital signs reviewed, Patient's Cardiovascular Status Stable, Respiratory Function Stable, Patent Airway and No signs of Nausea or vomiting  Post-op Vital Signs: Reviewed and stable  Complications: No apparent anesthesia complications

## 2016-08-06 NOTE — Anesthesia Procedure Notes (Signed)
Procedure Name: MAC Performed by: Orville Mena Pre-anesthesia Checklist: Patient identified, Emergency Drugs available, Suction available, Timeout performed and Patient being monitored Patient Re-evaluated:Patient Re-evaluated prior to inductionOxygen Delivery Method: Nasal cannula Placement Confirmation: positive ETCO2     

## 2016-08-06 NOTE — Op Note (Signed)
[facility] Patient Name: Tina Donovan Procedure Date: 08/06/2016 11:44 AM MRN: [id number] Account #: [id number] Date of Birth: July 03, 1976 Admit Type: Outpatient Age: 40 Room: [facility] OR ROOM 01 Gender: Female Note Status: Finalized Procedure:            Upper GI endoscopy Indications:          Epigastric abdominal pain, Nausea Providers:            Midge Minium MD, MD Medicines:            Propofol per Anesthesia Complications:        No immediate complications. Procedure:            Pre-Anesthesia Assessment:                       - Prior to the procedure, a History and Physical was                        performed, and patient medications and allergies were                        reviewed. The patient's tolerance of previous                        anesthesia was also reviewed. The risks and benefits of                        the procedure and the sedation options and risks were                        discussed with the patient. All questions were                        answered, and informed consent was obtained. Prior                        Anticoagulants: The patient has taken no previous                        anticoagulant or antiplatelet agents. ASA Grade                        Assessment: II - A patient with mild systemic disease.                        After reviewing the risks and benefits, the patient was                        deemed in satisfactory condition to undergo the                        procedure.                       After obtaining informed consent, the endoscope was                        passed under direct vision. Throughout the procedure,                        the patient's blood  pressure, pulse, and oxygen                        saturations were monitored continuously. The Olympus                        GIF-HQ190 Endoscope (S#. [id number]) was introduced                        through the mouth, and advanced to the second  part of                        duodenum. The upper GI endoscopy was accomplished                        without difficulty. The patient tolerated the procedure                        well. Findings:      The examined esophagus was normal.      A large amount of food (residue) was found in the entire examined       stomach.      Food (residue) was found in the entire duodenum. Impression:           - Normal esophagus.                       - A large amount of food (residue) in the stomach.                       - Retained food in the duodenum.                       - No specimens collected. Recommendation:       - Do a gastric emptying study. Procedure Code(s):    --- Professional ---                       [phone], Esophagogastroduodenoscopy, flexible, transoral;                        diagnostic, including collection of specimen(s) by                        brushing or washing, when performed (separate procedure) Diagnosis Code(s):    --- Professional ---                       R11.0, Nausea                       R10.13, Epigastric pain CPT copyright 2016 American Medical Association. All rights reserved. The codes documented in this report are preliminary and upon coder review may  be revised to meet current compliance requirements. Midge Minium MD, MD 08/06/2016 11:59:41 AM This report has been signed electronically. Number of Addenda: 0 Note Initiated On: 08/06/2016 11:44 AM      [facility]

## 2016-08-06 NOTE — H&P (Signed)
Midge Minium, MD [facility] [address] [place], [place] [address] Phone: [phone] Fax : [phone]  Primary Care Physician:  No PCP Per Patient Primary Gastroenterologist:  Dr. Servando Snare  Pre-Procedure History & Physical: HPI:  Tina Donovan is a 40 y.o. female is here for an endoscopy.   Past Medical History:  Diagnosis Date  . Anxiety   . Arthritis    Rheumatoid  . Bartonella infection   . Chronic kidney disease   . Chronic pain    CRPS  . Chronic pancreatitis (HCC)   . Complication of anesthesia    Pt wakes up during procedures  . Constipation   . Depression   . GERD (gastroesophageal reflux disease)   . Headache    migraines - 2-3x/mo  . Hypertension   . Kidney stone   . Post-Lyme disease syndrome   . Pyelonephritis   . Rectal prolapse   . Small fiber neuropathy [facility])     Past Surgical History:  Procedure Laterality Date  . CHOLECYSTECTOMY  2005   [facility]  . COLONOSCOPY  1998  . CYSTOSCOPY W/ SPINCTEROTOMY  2007  . ESOPHAGOGASTRODUODENOSCOPY ENDOSCOPY  2005   2007, 2009 and 2016  . KNEE SURGERY  1998  . PORTACATH PLACEMENT  2005  . SPINAL CORD STIMULATOR INSERTION  2009   2017 - currently turned off - pt not using    Prior to Admission medications   Medication Sig Start Date End Date Taking? Authorizing Provider  ALPRAZolam Prudy Feeler) 1 MG tablet 1 tablet. 03/11/16  Yes Historical Provider, MD  aspirin-acetaminophen-caffeine (EXCEDRIN MIGRAINE) [phone] MG tablet Take by mouth.   Yes Historical Provider, MD  dexlansoprazole (DEXILANT) 60 MG capsule Take 1 capsule (60 mg total) by mouth daily. 04/13/16  Yes Midge Minium, MD  diazepam (VALIUM) 5 MG tablet Take 5 mg by mouth.   Yes Historical Provider, MD  HYDROmorphone (DILAUDID) 8 MG tablet Take 8 mg by mouth. 03/10/15  Yes Historical Provider, MD  linaclotide Karlene Einstein) 290 MCG CAPS capsule Take 1 capsule (290 mcg total) by mouth daily before breakfast. 04/13/16  Yes Midge Minium, MD  morphine (MS CONTIN) 60 MG  12 hr tablet Take 1 tablet by mouth. 02/21/16  Yes Historical Provider, MD  nitroGLYCERIN (NITROSTAT) 0.3 MG SL tablet 0.3 mg. 01/07/12  Yes Historical Provider, MD  ondansetron (ZOFRAN) 8 MG tablet TAKE 1 TABLET UP TO 3 TIMES DAILY AS NEEDED 06/12/16  Yes Historical Provider, MD  Oxycodone HCl 20 MG TABS Take 1 tablet by mouth. 03/04/16  Yes Historical Provider, MD  promethazine (PHENERGAN) 25 MG tablet Take 25 mg by mouth 2 (two) times daily as needed. 01/10/16  Yes Historical Provider, MD  propranolol ER (INDERAL LA) 60 MG 24 hr capsule  09/11/15  Yes Historical Provider, MD  tiZANidine (ZANAFLEX) 4 MG tablet Take 4-8 mg by mouth. 12/28/14  Yes Historical Provider, MD  traZODone (DESYREL) 150 MG tablet Take 150 mg by mouth. 03/15/14  Yes Historical Provider, MD  traZODone (DESYREL) 50 MG tablet  09/11/10  Yes Historical Provider, MD  zolpidem (AMBIEN) 10 MG tablet  06/21/15  Yes Historical Provider, MD    Allergies as of 07/18/2016 - Review Complete 07/18/2016  Allergen Reaction Noted  . Duloxetine Other (See Comments) 03/14/2016  . Levetiracetam Other (See Comments) 03/14/2016  . Levofloxacin Other (See Comments) 03/14/2016  . Other  03/14/2016    Family History  Problem Relation Age of Onset  . Alcohol abuse Father   . Heart disease  Father   . Dementia Father   . Cancer Mother     breast and uterine    Social History   Social History  . Marital status: Married    Spouse name: N/A  . Number of children: N/A  . Years of education: N/A   Occupational History  . Not on file.   Social History Main Topics  . Smoking status: Never Smoker  . Smokeless tobacco: Never Used  . Alcohol use No  . Drug use: No  . Sexual activity: Not on file   Other Topics Concern  . Not on file   Social History Narrative  . No narrative on file    Review of Systems: See HPI, otherwise negative ROS  Physical Exam: BP 118/86   Pulse 84   Temp 98.2 F (36.8 C) (Temporal)   Resp 16   Ht 5\' 4"   (1.626 m)   Wt 148 lb (67.1 kg)   LMP  (Approximate) Comment: preg test neg; mirena-has been bleeding since placed in june  SpO2 100%   BMI 25.40 kg/m  General:   Alert,  pleasant and cooperative in NAD Head:  Normocephalic and atraumatic. Neck:  Supple; no masses or thyromegaly. Lungs:  Clear throughout to auscultation.    Heart:  Regular rate and rhythm. Abdomen:  Soft, nontender and nondistended. Normal bowel sounds, without guarding, and without rebound.   Neurologic:  Alert and  oriented x4;  grossly normal neurologically.  Impression/Plan: Tina Donovan is here for an endoscopy to be performed for nausea and epigastric pain  Risks, benefits, limitations, and alternatives regarding  endoscopy have been reviewed with the patient.  Questions have been answered.  All parties agreeable.   Waymon Amato, MD  08/06/2016, 11:44 AM

## 2016-08-07 ENCOUNTER — Encounter: Payer: Self-pay | Admitting: Gastroenterology

## 2016-08-23 ENCOUNTER — Other Ambulatory Visit: Payer: Self-pay

## 2016-08-23 ENCOUNTER — Telehealth: Payer: Self-pay | Admitting: Gastroenterology

## 2016-08-23 DIAGNOSIS — R112 Nausea with vomiting, unspecified: Secondary | ICD-10-CM

## 2016-08-23 NOTE — Telephone Encounter (Signed)
Patient left a voice message for you to call her. °

## 2016-08-24 NOTE — Telephone Encounter (Signed)
Advised pt per Dr. Servando Snare, he does want her to move forward with gastric emptying study. Pt has been scheduled at [facility] [place] location on Friday, Nov 2nd at 9:30am. Pt has been to advised to arrive at 9:15am, be NPO after midnight and hold anti nausea medications 8 hours prior.

## 2016-09-06 DIAGNOSIS — R55 Syncope and collapse: Secondary | ICD-10-CM | POA: Insufficient documentation

## 2016-09-07 ENCOUNTER — Ambulatory Visit: Payer: BLUE CROSS/BLUE SHIELD

## 2016-09-20 ENCOUNTER — Other Ambulatory Visit: Payer: Self-pay

## 2016-09-20 ENCOUNTER — Other Ambulatory Visit
Admission: RE | Admit: 2016-09-20 | Discharge: 2016-09-20 | Disposition: A | Discharge status: Home / Self Care | Payer: BLUE CROSS/BLUE SHIELD | Source: Ambulatory Visit | Attending: Gastroenterology | Admitting: Gastroenterology

## 2016-09-20 DIAGNOSIS — Z01812 Encounter for preprocedural laboratory examination: Secondary | ICD-10-CM

## 2016-09-20 LAB — PREGNANCY, URINE: Preg Test, Ur: NEGATIVE

## 2016-09-21 ENCOUNTER — Ambulatory Visit
Admission: RE | Admit: 2016-09-21 | Discharge: 2016-09-21 | Disposition: A | Discharge status: Home / Self Care | Payer: BLUE CROSS/BLUE SHIELD | Source: Ambulatory Visit | Attending: Gastroenterology | Admitting: Gastroenterology

## 2016-09-21 DIAGNOSIS — R112 Nausea with vomiting, unspecified: Secondary | ICD-10-CM | POA: Diagnosis present

## 2016-09-21 MED ORDER — TECHNETIUM TC 99M SULFUR COLLOID
2.0000 | Freq: Once | INTRAVENOUS | Status: AC | PRN
  Administered 2016-09-21: 2.77 via INTRAVENOUS

## 2016-09-24 ENCOUNTER — Telehealth: Payer: Self-pay

## 2016-09-24 NOTE — Telephone Encounter (Signed)
-----   Message from Midge Minium, MD sent at 09/23/2016  3:44 PM EST ----- Let the patient know that her gastric emptying study was normal

## 2016-09-24 NOTE — Telephone Encounter (Signed)
Left vm letting pt know gastric emptying study was normal.  

## 2016-09-25 ENCOUNTER — Telehealth: Payer: Self-pay | Admitting: Gastroenterology

## 2016-09-25 NOTE — Telephone Encounter (Signed)
results

## 2016-09-25 NOTE — Telephone Encounter (Signed)
Pt again advised of gastric emptying study. Scheduled a follow up with Dr. Servando Snare.

## 2016-10-11 ENCOUNTER — Other Ambulatory Visit: Payer: Self-pay

## 2016-10-11 ENCOUNTER — Ambulatory Visit (INDEPENDENT_AMBULATORY_CARE_PROVIDER_SITE_OTHER): Payer: BLUE CROSS/BLUE SHIELD | Admitting: Gastroenterology

## 2016-10-11 ENCOUNTER — Encounter: Payer: Self-pay | Admitting: Gastroenterology

## 2016-10-11 VITALS — BP 136/94 | HR 80 | Temp 98.4°F | Ht 67.0 in | Wt 143.0 lb

## 2016-10-11 DIAGNOSIS — R110 Nausea: Secondary | ICD-10-CM

## 2016-10-11 DIAGNOSIS — K5909 Other constipation: Secondary | ICD-10-CM

## 2016-10-11 DIAGNOSIS — K5904 Chronic idiopathic constipation: Secondary | ICD-10-CM

## 2016-10-11 NOTE — Progress Notes (Signed)
Primary Care Physician: No PCP Per Patient  Primary Gastroenterologist:  Dr. Midge Minium  Chief Complaint  Patient presents with  . Follow-up    Gastric emptying study    HPI: Tina Donovan is a 40 y.o. female here for follow-up of her multiple medical problems including results of her gastric emptying study and constipation. The patient reports that now she is having diarrhea and if she backs off on any of her medications the diarrhea turned into constipation. She is much happier with the diarrhea then with the constipation. The patient also reports that she has continued to lose some weight. The patient had a gastric empty study due to an upper endoscopy showing her having retained food in the stomach. The gastric emptying study was completely normal throughout the 4 hours of the exam. The patient also has nausea that she states comes on when she thinks about food smells food or even when she puts things in her mouth.  Current Outpatient Prescriptions  Medication Sig Dispense Refill  . ALPRAZolam (XANAX) 1 MG tablet 1 tablet.  0  . Ascorbic Acid (VITAMIN C) 100 MG tablet Take 100 mg by mouth daily.    Marland Kitchen aspirin-acetaminophen-caffeine (EXCEDRIN MIGRAINE) 250-250-65 MG tablet Take by mouth.    . dexlansoprazole (DEXILANT) 60 MG capsule Take 1 capsule (60 mg total) by mouth daily. 30 capsule 11  . diazepam (VALIUM) 5 MG tablet Take 5 mg by mouth.    . diphenhydrAMINE (BENADRYL) 50 MG/ML injection See admin instructions.  5  . Emollient (COCONUT OIL BEAUTY) CREA Apply topically.    Marland Kitchen HYDROmorphone (DILAUDID) 8 MG tablet Take 8 mg by mouth.    . linaclotide (LINZESS) 290 MCG CAPS capsule Take 1 capsule (290 mcg total) by mouth daily before breakfast. 30 capsule 11  . magnesium hydroxide (MILK OF MAGNESIA) 400 MG/5ML suspension Take by mouth.    . morphine (MS CONTIN) 60 MG 12 hr tablet Take 1 tablet by mouth.  0  . nitroGLYCERIN (NITROSTAT) 0.3 MG SL tablet 0.3 mg.    . ondansetron  (ZOFRAN) 8 MG tablet TAKE 1 TABLET UP TO 3 TIMES DAILY AS NEEDED  5  . ondansetron (ZOFRAN-ODT) 8 MG disintegrating tablet 8 mg.    . Oxycodone HCl 20 MG TABS Take 1 tablet by mouth.  0  . polyethylene glycol (MIRALAX / GLYCOLAX) packet Take 17 g by mouth daily.    . promethazine (PHENERGAN) 25 MG tablet Take 25 mg by mouth 2 (two) times daily as needed.  4  . propranolol ER (INDERAL LA) 60 MG 24 hr capsule     . tiZANidine (ZANAFLEX) 4 MG tablet Take 4-8 mg by mouth.    . traZODone (DESYREL) 150 MG tablet Take 150 mg by mouth.    . zolpidem (AMBIEN) 10 MG tablet      No current facility-administered medications for this visit.     Allergies as of 10/11/2016 - Review Complete 10/11/2016  Allergen Reaction Noted  . Duloxetine Other (See Comments) 03/14/2016  . Levetiracetam Other (See Comments) 03/14/2016  . Levofloxacin Other (See Comments) 03/14/2016  . Other  03/14/2016    ROS:  General: Negative for anorexia, weight loss, fever, chills, fatigue, weakness. ENT: Negative for hoarseness, difficulty swallowing , nasal congestion. CV: Negative for chest pain, angina, palpitations, dyspnea on exertion, peripheral edema.  Respiratory: Negative for dyspnea at rest, dyspnea on exertion, cough, sputum, wheezing.  GI: See history of present illness. GU:  Negative for dysuria, hematuria,  urinary incontinence, urinary frequency, nocturnal urination.  Endo: Negative for unusual weight change.    Physical Examination:   BP (!) 136/94   Pulse 80   Temp 98.4 F (36.9 C) (Oral)   Ht 5\' 7"  (1.702 m)   Wt 143 lb (64.9 kg)   BMI 22.40 kg/m   General: Well-nourished, well-developed in no acute distress.  Eyes: No icterus. Conjunctivae pink. Mouth: Oropharyngeal mucosa moist and pink , no lNeuro: Alert and oriented x 3.  Grossly intact. Skin: Warm and dry, no jaundice.   Psych: Alert and cooperative, normal mood and affect.  Labs:    Imaging Studies: Nm Gastric Emptying  Result Date:  09/21/2016 CLINICAL DATA:  Nausea and vomiting with chronic constipation and right epigastric pain. EXAM: NUCLEAR MEDICINE GASTRIC EMPTYING SCAN TECHNIQUE: After oral ingestion of radiolabeled meal, sequential abdominal images were obtained for 4 hours. Percentage of activity emptying the stomach was calculated at 1 hour, 2 hour, 3 hour, and 4 hours. RADIOPHARMACEUTICALS:  2.8 mCi Tc-17m sulfur colloid in standardized meal COMPARISON:  07/23/2016 CT scan FINDINGS: Expected location of the stomach in the left upper quadrant. Ingested meal empties the stomach gradually over the course of the study. 30% emptied at 1 hr ( normal >= 10%) 61% emptied at 2 hr ( normal >= 40%) 82% emptied at 3 hr ( normal >= 70%) 93% emptied at 4 hr ( normal >= 90%) IMPRESSION: Normal gastric emptying study. Electronically Signed   By: 07/25/2016 M.D.   On: 09/21/2016 15:20    Assessment and Plan:   Tina Donovan is a 40 y.o. y/o female who had a gastric emptying study that was normal. The patient had retained food in her stomach at the last EGD. The patient may have intermittent motility issues that caused her to have a retained food at the time of her upper endoscopy. The patient has been told to increase fiber in her diet to try to bulk up the soft stools she is having. She has stool samples pending from another doctor and from examining the requisition it appears that pancreatic insufficiency will be tested with those stool samples. The patient has been told to contact me with those results. The patient will continue treating her nausea with her Zofran and Phenergan.  30 minutes of the patient's visit was spent with counseling the patient.    45, MD. Midge Minium   Note: This dictation was prepared with Dragon dictation along with smaller phrase technology. Any transcriptional errors that result from this process are unintentional.

## 2016-10-17 ENCOUNTER — Other Ambulatory Visit
Admission: RE | Admit: 2016-10-17 | Discharge: 2016-10-17 | Disposition: A | Discharge status: Home / Self Care | Payer: BLUE CROSS/BLUE SHIELD | Source: Ambulatory Visit | Attending: Cardiology | Admitting: Cardiology

## 2016-10-17 DIAGNOSIS — R55 Syncope and collapse: Secondary | ICD-10-CM | POA: Diagnosis present

## 2016-10-17 DIAGNOSIS — G909 Disorder of the autonomic nervous system, unspecified: Secondary | ICD-10-CM | POA: Diagnosis present

## 2016-10-18 LAB — MISC LABCORP TEST (SEND OUT): LABCORP TEST CODE: 144618

## 2016-10-22 LAB — MISC LABCORP TEST (SEND OUT)
LABCORP TEST CODE: 4143
LabCorp test name: 24
Labcorp test code: 4069

## 2016-10-22 LAB — METANEPHRINES, URINE, 24 HOUR
METANEPH TOTAL UR: 106 ug/L
Metanephrines, 24H Ur: 140 ug/24 hr (ref 45–290)
NORMETANEPHRINE 24H UR: 170 ug/(24.h) (ref 82–500)
NORMETANEPHRINE UR: 128 ug/L
TOTAL VOLUME: 1325

## 2016-12-04 DIAGNOSIS — I7300 Raynaud's syndrome without gangrene: Secondary | ICD-10-CM | POA: Insufficient documentation

## 2016-12-04 DIAGNOSIS — M6289 Other specified disorders of muscle: Secondary | ICD-10-CM | POA: Insufficient documentation

## 2017-03-02 ENCOUNTER — Other Ambulatory Visit: Payer: Self-pay | Admitting: Gastroenterology

## 2017-03-21 ENCOUNTER — Other Ambulatory Visit: Payer: Self-pay

## 2017-03-21 MED ORDER — LINACLOTIDE 290 MCG PO CAPS: 290.0000 ug | ORAL_CAPSULE | Freq: Every day | ORAL | 11 refills | Status: AC

## 2017-03-25 ENCOUNTER — Telehealth: Payer: Self-pay | Admitting: Gastroenterology

## 2017-03-25 NOTE — Telephone Encounter (Signed)
But the patient know that I have exhausted all the recommendations that I can give her concerning her problems.  This is gone on for a very long time and a ASAP appointment is not needed unless something has recently changed.  I recommend she see a colonic motility specialist at [facility] since she has not improved with all of my previous recommendations.

## 2017-03-25 NOTE — Telephone Encounter (Signed)
Patient called this morning stating she has not had a bowel movement in five days and feels like she is going to have a rectal prolapse. When do you want to get her in?

## 2017-03-25 NOTE — Telephone Encounter (Signed)
Pt stated despite all of your previous recommendations and medication, nothing is working. Pt would like an office appt ASAP. We are booked. Please let me know what day she can come in.

## 2017-03-26 NOTE — Telephone Encounter (Signed)
Advised pt of recommendation to refer to a motility specialist at [facility]. Faxed to referral to [facility] gastroenterology specialty clinic.

## 2017-05-12 ENCOUNTER — Other Ambulatory Visit: Payer: Self-pay | Admitting: Gastroenterology

## 2017-08-23 DIAGNOSIS — R1310 Dysphagia, unspecified: Secondary | ICD-10-CM | POA: Insufficient documentation

## 2018-03-21 IMAGING — NM NM GASTRIC EMPTYING
6 series · 20 of 20 positions shown · non-contrast
Comparison: 07/23/2016 CT scan

CLINICAL DATA: Nausea and vomiting with chronic constipation and
right epigastric pain.

EXAM:
NUCLEAR MEDICINE GASTRIC EMPTYING SCAN
TECHNIQUE: After oral ingestion of radiolabeled meal, sequential abdominal
images were obtained for 4 hours. Percentage of activity emptying
the stomach was calculated at 1 hour, 2 hour, 3 hour, and 4 hours.
RADIOPHARMACEUTICALS:  2.8 mCi Ic-CCm sulfur colloid in standardized
meal

[Series 1000: gastic statics 4 hour (results) · 3.90mm/px · 5 acquisitions, 10 frames shown]
[im 1/5]
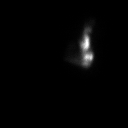
[im 1/5]
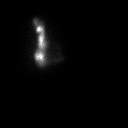
[im 2/5]
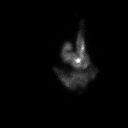
[im 2/5]
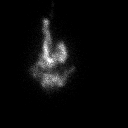
[im 3/5]
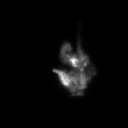
[im 3/5]
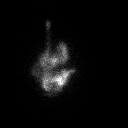
[im 4/5]
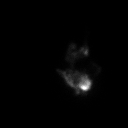
[im 4/5]
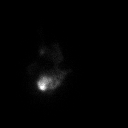
[im 5/5]
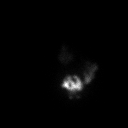
[im 5/5]
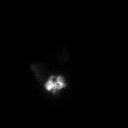

[Series 1000: gastric statics · 3.90mm/px · 2 of 2 frames shown]
[frame 1/2]
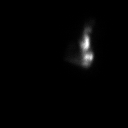
[frame 2/2]
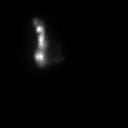

[Series 1000: gastric statics 1 hour · 3.90mm/px · 2 of 2 frames shown]
[frame 1/2]
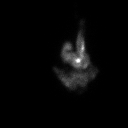
[frame 2/2]
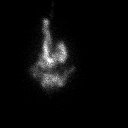

[Series 1000: gastric statics 3 hour · 3.90mm/px · 2 of 2 frames shown]
[frame 1/2]
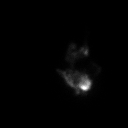
[frame 2/2]
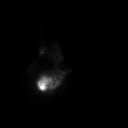

[Series 1000: gastric statics 2 hour · 3.90mm/px · 2 of 2 frames shown]
[frame 1/2]
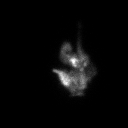
[frame 2/2]
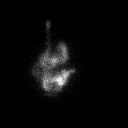

[Series 1000: gastic statics 4 hour · 3.90mm/px · 2 of 2 frames shown]
[frame 1/2]
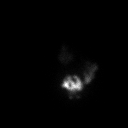
[frame 2/2]
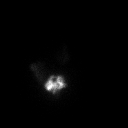

[20 of 20 positions shown; findings below may reference images not displayed]

FINDINGS: Expected location of the stomach in the left upper quadrant.
Ingested meal empties the stomach gradually over the course of the
study.

30% emptied at 1 hr ( normal >= 10%)

61% emptied at 2 hr ( normal >= 40%)

82% emptied at 3 hr ( normal >= 70%)

93% emptied at 4 hr ( normal >= 90%)
IMPRESSION: Normal gastric emptying study.

## 2018-05-23 ENCOUNTER — Other Ambulatory Visit: Payer: Self-pay | Admitting: Internal Medicine

## 2018-05-23 ENCOUNTER — Ambulatory Visit
Admission: RE | Admit: 2018-05-23 | Discharge: 2018-05-23 | Disposition: A | Discharge status: Home / Self Care | Payer: BLUE CROSS/BLUE SHIELD | Source: Ambulatory Visit | Attending: Internal Medicine | Admitting: Internal Medicine

## 2018-05-23 DIAGNOSIS — M25431 Effusion, right wrist: Secondary | ICD-10-CM | POA: Insufficient documentation

## 2018-05-23 DIAGNOSIS — M25541 Pain in joints of right hand: Secondary | ICD-10-CM | POA: Diagnosis present

## 2018-05-23 DIAGNOSIS — M25432 Effusion, left wrist: Secondary | ICD-10-CM | POA: Insufficient documentation

## 2018-05-23 DIAGNOSIS — M25542 Pain in joints of left hand: Principal | ICD-10-CM

## 2018-06-25 DIAGNOSIS — K219 Gastro-esophageal reflux disease without esophagitis: Secondary | ICD-10-CM | POA: Insufficient documentation

## 2018-06-25 DIAGNOSIS — F410 Panic disorder [episodic paroxysmal anxiety] without agoraphobia: Secondary | ICD-10-CM | POA: Insufficient documentation

## 2020-01-03 DIAGNOSIS — R339 Retention of urine, unspecified: Secondary | ICD-10-CM | POA: Insufficient documentation

## 2020-01-03 DIAGNOSIS — R21 Rash and other nonspecific skin eruption: Secondary | ICD-10-CM | POA: Insufficient documentation

## 2020-01-29 ENCOUNTER — Encounter: Payer: Self-pay | Admitting: Emergency Medicine

## 2020-01-29 ENCOUNTER — Other Ambulatory Visit: Payer: Self-pay

## 2020-01-29 ENCOUNTER — Ambulatory Visit
Admission: EM | Admit: 2020-01-29 | Discharge: 2020-01-29 | Disposition: A | Discharge status: Home / Self Care | Payer: BC Managed Care – PPO | Attending: Family Medicine | Admitting: Family Medicine

## 2020-01-29 DIAGNOSIS — B880 Other acariasis: Secondary | ICD-10-CM

## 2020-01-29 DIAGNOSIS — R600 Localized edema: Secondary | ICD-10-CM | POA: Diagnosis not present

## 2020-01-29 MED ORDER — IVERMECTIN 3 MG PO TABS: 200.0000 ug/kg | ORAL_TABLET | ORAL | 0 refills | Status: AC

## 2020-01-29 NOTE — Discharge Instructions (Signed)
Ivermectin as prescribed.  Compression. Elevation. Discuss with pain physician. Consider starting Lasix.  Take care  Dr. Adriana Simas

## 2020-01-29 NOTE — ED Provider Notes (Signed)
[facility]    CSN: [id number] Arrival date & time: 01/29/20  1613      History   Chief Complaint Chief Complaint  Patient presents with  . Leg Pain   HPI  44 year old female with an extensive past medical history including complex regional pain syndrome presents with bilateral leg swelling as well as pain.  She is also concerned that she has a parasitic infection.  Patient reports that over the past week she has had itching and rash.  She is a International aid/development worker.  She is concerned that she has Demodex infection.  This appears to be an ongoing issue.    Medical records reviewed regarding her ongoing complaints: Hospitalization 2/28-3/3 - Records reviewed.  Summarized as follows: Admitted for evaluation regarding inability to defecate or urinate for 5 days.  Also  complained of rash.  Had narcotic induced constipation.  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  Psychiatry was consulted.  She had negative stool studies for parasites.  Stool study was reviewed.  No parasites found.  Negative Giardia.  Negative Cryptosporidium.  Ophthalmology visit from 3/17 -records reviewed.  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  Was advised to use tea tree oil scrubs.  ER visit on 3/17.  Records reviewed.  Presented reporting Demadex infection for weeks to months.  Presented several pictures to the physician.  No appreciable parasites were visualized.  She had a negative work-up in the ER and was discharged home with primary care follow-up.  In addition to reporting ongoing rash which she believes is secondary to a Demodex infection, she reports lower extremity pain and edema.  She states that she is managed by pain management physician in [place].  She is on high-dose narcotics.  She states that she wears compression stockings.  She was prescribed Lasix recently by her primary care physician's office but has not taken it.  No other reported symptoms.  No other  complaints.  Past Medical History:  Diagnosis Date  . Anxiety   . Arthritis    Rheumatoid  . Bartonella infection   . Chronic kidney disease   . Chronic pain    CRPS  . Chronic pancreatitis (HCC)   . Complication of anesthesia    Pt wakes up during procedures  . Constipation   . Depression   . GERD (gastroesophageal reflux disease)   . Headache    migraines - 2-3x/mo  . Hypertension   . Kidney stone   . Post-Lyme disease syndrome   . Pyelonephritis   . Rectal prolapse   . Small fiber neuropathy     Patient Active Problem List   Diagnosis Date Noted  . Near syncope 09/06/2016  . Syncope and collapse 09/06/2016  . Nausea   . Right ovarian cyst 08/03/2016  . Chronic constipation 03/14/2016  . Chronic pain 03/14/2016  . Chronic pancreatitis (HCC) 03/14/2016  . Complex regional pain syndrome type 2 of upper extremity 03/14/2016  . Personal history of urinary infection 03/14/2016  . Calculus of kidney 03/14/2016  . Sequelae of other specified infectious and parasitic diseases 03/14/2016  . 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 Rheumatoid arthritis involving multiple joints (HCC) 03/14/2016  . Small fiber neuropathy 03/14/2016  . Abdominal pain, epigastric 07/14/2014  . Hereditary and idiopathic neuropathy 05/28/2014  . H/O urinary tract infection 05/28/2014  . Infectious or parasitic disease, late effect 05/28/2014  . Ache in joint 10/20/2013  . Complex regional pain syndrome 03/12/2013  .  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 Avitaminosis D 01/07/2012  . Anxiety 11/08/2011  . Clinical depression 11/08/2011    Past Surgical History:  Procedure Laterality Date  . CHOLECYSTECTOMY  2005   [facility]  . COLONOSCOPY  1998  . CYSTOSCOPY W/ SPINCTEROTOMY  2007  . ESOPHAGOGASTRODUODENOSCOPY (EGD) WITH PROPOFOL N/A 08/06/2016   Procedure: ESOPHAGOGASTRODUODENOSCOPY (EGD) WITH PROPOFOL;  Surgeon: Midge Minium, MD;  Location: [facility];   Service: Endoscopy;  Laterality: N/A;  . ESOPHAGOGASTRODUODENOSCOPY ENDOSCOPY  2005   2007, 2009 and 2016  . KNEE SURGERY  1998  . PORTACATH PLACEMENT  2005  . SPINAL CORD STIMULATOR INSERTION  2009   2017 - currently turned off - pt not using    OB History   No obstetric history on file.      Home Medications    Prior to Admission medications   Medication Sig Start Date End Date Taking? Authorizing Provider  ALPRAZolam Prudy Feeler) 1 MG tablet 1 tablet. 03/11/16  Yes [provider]  Ascorbic Acid (VITAMIN C) 100 MG tablet Take 100 mg by mouth daily.   Yes [provider]  aspirin-acetaminophen-caffeine (EXCEDRIN MIGRAINE) [phone] MG tablet Take by mouth.   Yes [provider]  DEXILANT 60 MG capsule TAKE 1 CAPSULE (60 MG TOTAL) BY MOUTH DAILY. 05/13/17  Yes Midge Minium, MD  diazepam (VALIUM) 5 MG tablet Take 5 mg by mouth.   Yes [provider]  diphenhydrAMINE (BENADRYL) 50 MG/ML injection See admin instructions. 10/06/16  Yes [provider]  HYDROmorphone (DILAUDID) 8 MG tablet Take 8 mg by mouth. 03/10/15  Yes [provider]  levonorgestrel (MIRENA) 20 MCG/24HR IUD 1 each by Intrauterine route once.   Yes [provider]  linaclotide Karlene Einstein) 290 MCG CAPS capsule Take 1 capsule (290 mcg total) by mouth daily before breakfast. 03/21/17  Yes Midge Minium, MD  morphine (MS CONTIN) 60 MG 12 hr tablet Take 1 tablet by mouth. 02/21/16  Yes [provider]  ondansetron (ZOFRAN) 8 MG tablet TAKE 1 TABLET UP TO 3 TIMES DAILY AS NEEDED 06/12/16  Yes [provider]  Oxycodone HCl 20 MG TABS Take 1 tablet by mouth. 03/04/16  Yes [provider]  promethazine (PHENERGAN) 25 MG tablet Take 25 mg by mouth 2 (two) times daily as needed. 01/10/16  Yes [provider]  propranolol ER (INDERAL LA) 60 MG 24 hr capsule  09/11/15  Yes [provider]  tiZANidine (ZANAFLEX) 4 MG tablet Take 4-8 mg by mouth.  12/28/14  Yes [provider]  traZODone (DESYREL) 150 MG tablet Take 150 mg by mouth. 03/15/14  Yes [provider]  Emollient (COCONUT OIL BEAUTY) CREA Apply topically.    [provider]  ivermectin (STROMECTOL) 3 MG TABS tablet Take 4.5 tablets (13,500 mcg total) by mouth once a week for 14 days. Repeat dosing in 1 week. 01/29/20 02/12/20  Tommie Sams, DO  magnesium hydroxide (MILK OF MAGNESIA) 400 MG/5ML suspension Take by mouth.    [provider]  nitroGLYCERIN (NITROSTAT) 0.3 MG SL tablet 0.3 mg. 01/07/12   [provider]  ondansetron (ZOFRAN-ODT) 8 MG disintegrating tablet 8 mg.    [provider]  polyethylene glycol (MIRALAX / GLYCOLAX) packet Take 17 g by mouth daily.    [provider]  zolpidem (AMBIEN) 10 MG tablet  06/21/15   [provider]    Family History Family History  Problem Relation Age of Onset  . Alcohol abuse Father   .  Heart disease Father   . Dementia Father   . Cancer Mother        breast and uterine    Social History Social History   Tobacco Use  . Smoking status: Never Smoker  . Smokeless tobacco: Never Used  Substance Use Topics  . Alcohol use: No    Alcohol/week: 0.0 standard drinks  . Drug use: No     Allergies   Duloxetine, Levetiracetam, Levofloxacin, and Other   Review of Systems Review of Systems  Cardiovascular: Positive for leg swelling.  Skin: Positive for rash.     Physical Exam Triage Vital Signs ED Triage Vitals  Enc Vitals Group     BP 01/29/20 1644 (!) 167/103     Pulse Rate 01/29/20 1644 70     Resp 01/29/20 1644 14     Temp 01/29/20 1644 98.6 F (37 C)     Temp Source 01/29/20 1644 Oral     SpO2 01/29/20 1644 100 %     Weight 01/29/20 1639 152 lb 1.9 oz (69 kg)     Height 01/29/20 1639 5\' 4"  (1.626 m)     Head Circumference --      Peak Flow --      Pain Score 01/29/20 1639 8     Pain Loc --      Pain Edu? --      Excl. in GC? --     Updated Vital Signs BP (!) 167/103 (BP Location: Left Arm)   Pulse 70   Temp 98.6 F (37 C) (Oral)   Resp 14   Ht 5\' 4"  (1.626 m)   Wt 69 kg   SpO2 100%   BMI 26.11 kg/m   Visual Acuity Right Eye Distance:   Left Eye Distance:   Bilateral Distance:    Right Eye Near:   Left Eye Near:    Bilateral Near:     Physical Exam Vitals and nursing note reviewed.  Constitutional:      General: She is not in acute distress.    Appearance: Normal appearance.  HENT:     Head: Normocephalic and atraumatic.  Cardiovascular:     Rate and Rhythm: Normal rate and regular rhythm.     Heart sounds: No murmur.  Pulmonary:     Effort: Pulmonary effort is normal.     Breath sounds: Normal breath sounds. No wheezing, rhonchi or rales.  Musculoskeletal:     Comments: 2-3+ pitting lower extremity edema which extends to the knee.  Skin:    Comments: Dry skin. No significant rash.  Neurological:     Mental Status: She is alert.    UC Treatments / Results  Labs (all labs ordered are listed, but only abnormal results are displayed) Labs Reviewed - No data to display  EKG   Radiology No results found.  Procedures Procedures (including critical care time)  Medications Ordered in UC Medications - No data to display  Initial Impression / Assessment and Plan / UC Course  I have reviewed the triage vital signs and the nursing notes.  Pertinent labs & imaging results that were available during my care of the patient were reviewed by me and considered in my medical decision making (see chart for details).    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  The etiology of her complaints is unclear at this time.  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  Patient very perseverative about her rash and presents numerous pictures which do not clearly  demonstrate infection.  Given her encounter with ophthalmology, I am placing her on empiric ivermectin.  Patient needs follow-up with primary care physician and other specialists.  Regarding her lower extremity edema, she does not appear to have congestive heart failure.  I suspect that this is dependent edema.  Advised compression and elevation.  Patient can consider taking Lasix as prescribed by her primary care physician's office.  **This is a complicated patient and a great deal of time was spent reviewing the electronic medical record regarding her recent care and medical issues.  I have reviewed several office notes/hospitalizations as indicated in the HPI.  I also reviewed her laboratory studies as documented in HPI.  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  Compression. Elevation. Discuss with pain physician. Consider starting Lasix.  Take care  Dr. Lacinda Axon    ED Prescriptions    Medication Sig Dispense Auth. Provider   ivermectin (STROMECTOL) 3 MG TABS tablet Take 4.5 tablets (13,500 mcg total) by mouth once a week for 14 days. Repeat dosing in 1 week. 9 tablet Thersa Salt G, DO     PDMP not reviewed this encounter.   Coral Spikes, [place] 01/29/20 [phone]

## 2020-01-29 NOTE — ED Triage Notes (Signed)
Patient c/o parasites on her skin for a week.  Patient c/o bilateral leg swelling and pain that started getting worse 3-4 days ago.  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

## 2021-02-07 ENCOUNTER — Ambulatory Visit
Admission: EM | Admit: 2021-02-07 | Discharge: 2021-02-07 | Disposition: A | Discharge status: Home / Self Care | Payer: BC Managed Care – PPO | Attending: Sports Medicine | Admitting: Sports Medicine

## 2021-02-07 ENCOUNTER — Encounter: Payer: Self-pay | Admitting: Emergency Medicine

## 2021-02-07 ENCOUNTER — Other Ambulatory Visit: Payer: Self-pay

## 2021-02-07 DIAGNOSIS — R29898 Other symptoms and signs involving the musculoskeletal system: Secondary | ICD-10-CM | POA: Diagnosis not present

## 2021-02-07 DIAGNOSIS — R200 Anesthesia of skin: Secondary | ICD-10-CM | POA: Diagnosis not present

## 2021-02-07 DIAGNOSIS — G5640 Causalgia of unspecified upper limb: Secondary | ICD-10-CM

## 2021-02-07 DIAGNOSIS — A6920 Lyme disease, unspecified: Secondary | ICD-10-CM | POA: Diagnosis not present

## 2021-02-07 DIAGNOSIS — R202 Paresthesia of skin: Secondary | ICD-10-CM

## 2021-02-07 DIAGNOSIS — G5631 Lesion of radial nerve, right upper limb: Secondary | ICD-10-CM

## 2021-02-07 DIAGNOSIS — K861 Other chronic pancreatitis: Secondary | ICD-10-CM

## 2021-02-07 NOTE — ED Notes (Signed)
Patient is being discharged from the Urgent Care and sent to the Emergency Department via POV . Per Dr. Zachery Dauer, patient is in need of higher level of care due to numbness in right arm. Patient is aware and verbalizes understanding of plan of care.  Vitals:   02/07/21 1702  BP: (!) 170/104  Pulse: 67  Resp: 18  Temp: 98.5 F (36.9 C)  SpO2: 100%

## 2021-02-07 NOTE — ED Provider Notes (Addendum)
[facility]    CSN: [id number] Arrival date & time: 02/07/21  1624      History   Chief Complaint Chief Complaint  Patient presents with  . Arm Pain    right    HPI Tina Donovan is a 45 y.o. female.   Patient is a pleasant 45 year old female who is right-hand dominant who presents for evaluation of the above issue.  She has a fairly complicated past medical history.  I have reviewed the chart in detail.  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  She reports that her blood pressure is always elevated due to her anxiety.  She is on a beta-blocker but no other antihypertensive medications.  She presents to the urgent care today reporting that she has been feeling unwell for about 3 days.  She called her primary care provider which is [facility] in [place] and they suggested she come to the urgent care.  She was having URI symptoms which included cough, rhinorrhea, facial pain, headache, nasal congestion.  She denies any fever shakes chills.  She has some chronic nausea vomiting diarrhea due to her pancreatic issues and her GI issues.  She also has a history of Raynaud's and has some skin manifestations and sees dermatology.  She also is followed by cardiology for her post Lyme disease syndrome.  Regarding her current situation she said that she had what sounds like a syncopal episode while sitting ironing 3 days ago.  She reports when she woke up the iron had burned a hole in the couch.  She showed me a picture of the.  Since then she has had some numbness that goes from her shoulder down into her hand.  She is lost significant use of that arm.  She is unable to extend her wrist against gravity.  She  reports that her numbness and tingling and pain is now involving the scapula.  She denies neck pain or decreased range of motion in her neck.  When she had her syncopal episode she is unsure whether or not she had an injury.  She is unsure if she had a shearing force involving the brachial plexus.  She denies any fever shakes chills.  She does have 2 Port-A-Cath 1 that she accesses for her pain management on the left side and the other one on the right side that she does not axis.  There is no erythema or warmth around the.  She denies any chest pain shortness of breath or any red flag signs or symptoms.     Past Medical History:  Diagnosis Date  . Anxiety   . Arthritis    Rheumatoid  . Bartonella infection   . Chronic kidney disease   . Chronic pain    CRPS  . Chronic pancreatitis (HCC)   . Complication of anesthesia    Pt wakes up during procedures  . Constipation   . Depression   . GERD (gastroesophageal reflux disease)   . Headache    migraines - 2-3x/mo  . Hypertension   . Kidney stone   . Post-Lyme disease syndrome   . Pyelonephritis   . Rectal prolapse   . Small fiber neuropathy     Patient Active Problem List   Diagnosis Date Noted  .  Near syncope 09/06/2016  . Syncope and collapse 09/06/2016  . Nausea   . Right ovarian cyst 08/03/2016  . Chronic constipation 03/14/2016  . Chronic pain 03/14/2016  . Chronic pancreatitis (HCC) 03/14/2016  . Complex regional pain syndrome type 2 of upper extremity 03/14/2016  . Personal history of urinary infection 03/14/2016  . Calculus of kidney 03/14/2016  . Sequelae of other specified infectious and parasitic diseases 03/14/2016  . 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 Rheumatoid arthritis involving multiple joints (HCC) 03/14/2016  . Small fiber neuropathy 03/14/2016  . Abdominal pain, epigastric 07/14/2014  . Hereditary and idiopathic neuropathy 05/28/2014  . H/O urinary tract infection  05/28/2014  . Infectious or parasitic disease, late effect 05/28/2014  . Ache in joint 10/20/2013  . Complex regional pain syndrome 03/12/2013  . 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 Avitaminosis D 01/07/2012  . Anxiety 11/08/2011  . Clinical depression 11/08/2011    Past Surgical History:  Procedure Laterality Date  . CHOLECYSTECTOMY  2005   [facility]  . COLONOSCOPY  1998  . CYSTOSCOPY W/ SPINCTEROTOMY  2007  . ESOPHAGOGASTRODUODENOSCOPY (EGD) WITH PROPOFOL N/A 08/06/2016   Procedure: ESOPHAGOGASTRODUODENOSCOPY (EGD) WITH PROPOFOL;  Surgeon: Midge Minium, MD;  Location: [facility];  Service: Endoscopy;  Laterality: N/A;  . ESOPHAGOGASTRODUODENOSCOPY ENDOSCOPY  2005   2007, 2009 and 2016  . KNEE SURGERY  1998  . PORTACATH PLACEMENT  2005  . SPINAL CORD STIMULATOR INSERTION  2009   2017 - currently turned off - pt not using    OB History   No obstetric history on file.      Home Medications    Prior to Admission medications   Medication Sig Start Date End Date Taking? Authorizing Provider  ALPRAZolam Prudy Feeler) 1 MG tablet 1 tablet. 03/11/16  Yes [provider]  Ascorbic Acid (VITAMIN C) 100 MG tablet Take 100 mg by mouth daily.   Yes [provider]  aspirin-acetaminophen-caffeine (EXCEDRIN MIGRAINE) [phone] MG tablet Take by mouth.   Yes [provider]  DEXILANT 60 MG capsule TAKE 1 CAPSULE (60 MG TOTAL) BY MOUTH DAILY. 05/13/17  Yes Midge Minium, MD  diphenhydrAMINE (BENADRYL) 50 MG/ML injection See admin instructions. 10/06/16  Yes [provider]  HYDROmorphone (DILAUDID) 8 MG tablet Take 8 mg by mouth. 03/10/15  Yes [provider]  levonorgestrel (MIRENA) 20 MCG/24HR IUD 1 each by Intrauterine route once.   Yes [provider]  linaclotide Karlene Einstein) 290 MCG CAPS capsule Take 1 capsule (290 mcg total) by mouth daily before breakfast. 03/21/17  Yes Midge Minium, MD  morphine (MS CONTIN) 60 MG 12 hr tablet Take 1 tablet by  mouth. 02/21/16  Yes [provider]  ondansetron (ZOFRAN) 8 MG tablet TAKE 1 TABLET UP TO 3 TIMES DAILY AS NEEDED 06/12/16  Yes [provider]  Oxycodone HCl 20 MG TABS Take 1 tablet by mouth. 03/04/16  Yes [provider]  promethazine (PHENERGAN) 25 MG tablet Take 25 mg by mouth 2 (two) times daily as needed. 01/10/16  Yes [provider]  propranolol ER (INDERAL LA) 60 MG 24 hr capsule  09/11/15  Yes [provider]  tiZANidine (ZANAFLEX) 4 MG tablet Take 4-8 mg by mouth. 12/28/14  Yes [provider]  diazepam (VALIUM) 5 MG tablet Take 5 mg by mouth.    [provider]  Emollient (COCONUT OIL BEAUTY) CREA Apply topically.    [provider]  magnesium hydroxide (MILK OF MAGNESIA) 400  MG/5ML suspension Take by mouth.    [provider]  nitroGLYCERIN (NITROSTAT) 0.3 MG SL tablet 0.3 mg. 01/07/12   [provider]  ondansetron (ZOFRAN-ODT) 8 MG disintegrating tablet 8 mg.    [provider]  polyethylene glycol (MIRALAX / GLYCOLAX) packet Take 17 g by mouth daily.    [provider]  traZODone (DESYREL) 150 MG tablet Take 150 mg by mouth. 03/15/14   [provider]  zolpidem (AMBIEN) 10 MG tablet  06/21/15   [provider]    Family History Family History  Problem Relation Age of Onset  . Alcohol abuse Father   . Heart disease Father   . Dementia Father   . Cancer Mother        breast and uterine    Social History Social History   Tobacco Use  . Smoking status: Never Smoker  . Smokeless tobacco: Never Used  Vaping Use  . Vaping Use: Never used  Substance Use Topics  . Alcohol use: No    Alcohol/week: 0.0 standard drinks  . Drug use: No     Allergies   Duloxetine, Levetiracetam, Levofloxacin, and Other   Review of Systems Review of Systems  Constitutional: Positive for activity change. Negative for appetite change, chills, diaphoresis, fatigue and fever.   HENT: Positive for congestion and rhinorrhea. Negative for drooling, ear pain, postnasal drip, sinus pressure, sinus pain, sore throat and trouble swallowing.   Eyes: Negative.  Negative for pain.  Respiratory: Positive for cough. Negative for apnea, choking, chest tightness, shortness of breath, wheezing and stridor.   Cardiovascular: Negative.  Negative for chest pain, palpitations and leg swelling.  Gastrointestinal: Positive for diarrhea, nausea and vomiting. Negative for abdominal pain and constipation.  Genitourinary: Negative.  Negative for dysuria and flank pain.  Musculoskeletal: Positive for arthralgias. Negative for back pain, gait problem, joint swelling, myalgias, neck pain and neck stiffness.  Skin: Negative for color change, pallor, rash and wound.  Allergic/Immunologic: Negative.   Neurological: Positive for syncope, weakness, numbness and headaches. Negative for dizziness, tremors, seizures, facial asymmetry, speech difficulty and light-headedness.  Psychiatric/Behavioral: Positive for suicidal ideas.  All other systems reviewed and are negative.    Physical Exam Triage Vital Signs ED Triage Vitals  Enc Vitals Group     BP 02/07/21 1702 (!) 170/104     Pulse Rate 02/07/21 1702 67     Resp 02/07/21 1702 18     Temp 02/07/21 1702 98.5 F (36.9 C)     Temp Source 02/07/21 1702 Oral     SpO2 02/07/21 1702 100 %     Weight 02/07/21 1657 152 lb 1.9 oz (69 kg)     Height 02/07/21 1657  (1.626 m)     Head Circumference --      Peak Flow --      Pain Score 02/07/21 1657 7     Pain Loc --      Pain Edu? --      Excl. in GC? --    No data found.  Updated Vital Signs BP (!) 170/104 (BP Location: Left Arm)   Pulse 67   Temp 98.5 F (36.9 C) (Oral)   Resp 18   Ht  (1.626 m)   Wt 69 kg   SpO2 100%   BMI 26.11 kg/m   Visual Acuity Right Eye Distance:   Left Eye Distance:   Bilateral Distance:    Right Eye Near:   Left Eye Near:  Bilateral Near:      Physical Exam Vitals and nursing note reviewed.  Constitutional:      General: She is not in acute distress.    Appearance: Normal appearance. She is obese. She is not ill-appearing, toxic-appearing or diaphoretic.  HENT:     Head: Normocephalic and atraumatic.     Right Ear: Tympanic membrane normal.     Left Ear: Tympanic membrane normal.     Nose: Congestion present. No rhinorrhea.     Mouth/Throat:     Mouth: Mucous membranes are moist.     Pharynx: No oropharyngeal exudate or posterior oropharyngeal erythema.  Eyes:     General: No visual field deficit or scleral icterus.       Right eye: No discharge.        Left eye: No discharge.     Extraocular Movements: Extraocular movements intact.     Conjunctiva/sclera: Conjunctivae normal.     Pupils: Pupils are equal, round, and reactive to light.  Cardiovascular:     Rate and Rhythm: Normal rate and regular rhythm.     Pulses: Normal pulses.     Heart sounds: Normal heart sounds. No murmur heard. No friction rub. No gallop.   Pulmonary:     Effort: Pulmonary effort is normal. No respiratory distress.     Breath sounds: Normal breath sounds. No stridor. No wheezing, rhonchi or rales.  Abdominal:     General: Bowel sounds are normal.     Palpations: Abdomen is soft.     Tenderness: There is no abdominal tenderness. There is no right CVA tenderness, left CVA tenderness, guarding or rebound.  Musculoskeletal:        General: Tenderness present. No swelling, deformity or signs of injury.     Right upper arm: Tenderness present. No swelling, edema, deformity, lacerations or bony tenderness.     Left upper arm: Normal. No swelling, edema, deformity, lacerations, tenderness or bony tenderness.     Right wrist: No swelling, deformity, effusion, lacerations, tenderness, bony tenderness, snuff box tenderness or crepitus. Decreased range of motion. Normal pulse.     Left wrist: Normal.     Cervical back: Normal range of motion and neck  supple. No rigidity or tenderness.     Right lower leg: No edema.     Left lower leg: No edema.  Lymphadenopathy:     Cervical: No cervical adenopathy.  Skin:    General: Skin is warm and dry.     Capillary Refill: Capillary refill takes less than 2 seconds.     Coloration: Skin is not jaundiced.     Findings: No bruising, erythema, lesion or rash.  Neurological:     Mental Status: She is alert and oriented to person, place, and time.     GCS: GCS eye subscore is 4. GCS verbal subscore is 5. GCS motor subscore is 6.     Cranial Nerves: Cranial nerves are intact. No cranial nerve deficit, dysarthria or facial asymmetry.     Motor: Weakness present. No tremor, atrophy, abnormal muscle tone, seizure activity or pronator drift.     Coordination: Finger-Nose-Finger Test abnormal.     Gait: Gait is intact. Gait normal.     Deep Tendon Reflexes: Reflexes are normal and symmetric.     Reflex Scores:      Tricep reflexes are 2+ on the right side and 2+ on the left side.      Bicep reflexes are 2+ on the right side and 2+  on the left side.      Brachioradialis reflexes are 2+ on the right side and 2+ on the left side.      Patellar reflexes are 2+ on the right side and 2+ on the left side.      Achilles reflexes are 2+ on the right side and 2+ on the left side.    Comments: No evidence of any cervical radiculopathy.  Left upper extremity is within normal limits.  Right upper extremity: There is no bony abnormality ecchymosis erythema soft tissue swelling.  She has decreased range of motion at the wrist.  Unable to extend her wrist with active range of motion against gravity.  Passively I am able to get her wrist into extension but she is unable to hold it there.  Wrist flexion appears to be 4+ out of 5.  Ulnar and radial deviation are 5 out of 5.  No evidence of any weakness at the elbow or shoulder.  Overall is a little bit of a limited exam secondary to her symptoms.  When I asked her to perform a  push-up against the wall there is no winging of the scapula although she does make a point of not being able to lift her wrist into extension without assistance from her contralateral hand. Seems consistent with a radial nerve palsy vs involvement of the PIN (but doesn't fit as this should not have any numbness or tingling).  Psychiatric:        Attention and Perception: Attention and perception normal.        Mood and Affect: Mood is anxious.        Behavior: Behavior is cooperative.      UC Treatments / Results  Labs (all labs ordered are listed, but only abnormal results are displayed) Labs Reviewed - No data to display  EKG   Radiology No results found.  Procedures Procedures (including critical care time)  Medications Ordered in UC Medications - No data to display  Initial Impression / Assessment and Plan / UC Course  I have reviewed the triage vital signs and the nursing notes.  Pertinent labs & imaging results that were available during my care of the patient were reviewed by me and considered in my medical decision making (see chart for details).  Clinical impression: Constellation of symptoms above in the HPI.  Appears as though she has some URI symptoms but exam is within normal limits.  She does have some baseline hypertension, and is only on a beta-blocker.  She is not showing any signs or symptoms of any neurological involvement secondary to her blood pressure being elevated.  She says that her blood pressure is always elevated and that her current reading is actually lower than it normally is. Regarding her right upper extremity pain and numbness with weakness and some loss of function, whcih seems to have radial nerve involvement (doubt PIN as she has N/T).  I am concerned about some underlying neurological issue either from the trauma of her syncopal episode or an issue with an unusual presentation due to her multiple medical issues including her chronic regional pain  syndrome as well as her post Lyme syndrome.  There may also be a psychosomatic component to her symptoms, although her exam seems reproducible and concerning.  Treatment plan: 1.  The findings and treatment plan were discussed in detail with the patient.  Patient was in agreement. 2.  We had a long discussion in the office today and I spent about  45 minutes or more with the patient.  I indicated to her that her physical exam findings and her presentation were certainly concerning.  I do not want to miss a window for potential treatment options given that it is already been 3 days.  I have limited resources here in the urgent care and I want her to go to the emergency room.  She expressed some concern about going to the ER at [facility] and [facility] as in the past she has not had a favorable experience.  That said, she agreed to go to [facility] for a higher level of care, advanced imaging that I cannot perform here in the urgent care, and a neurological consultation in the emergency room.  She agreed to this plan.  If her ER/neurological consultation is reassuring, then I would recommend Ortho Hand or Neurosurgical referral for radial nerve injury/dysfunction/entrapment. 3.  Although she fell, I did not feel the need to do any plain x-rays as I did not feel as though she had a fracture. 4.  I called the charge nurse at [facility] and discussed the case and they were expecting her. 5.  Discharge from the urgent care.  She was being transported by her husband, at least that is what she told me, and she will go directly to [facility] ED.  She was discharged in stable condition.    Final Clinical Impressions(s) / UC Diagnoses   Final diagnoses:  Weakness of right arm  Numbness and tingling of right arm  Complex regional pain syndrome type 2 of upper extremity, unspecified laterality  Lyme borreliosis  Idiopathic chronic pancreatitis (HCC)  Radial nerve dysfunction, right     Discharge Instructions     As we discussed, my  concern is that with 3 days of numbness and tingling going down the right arm and now loss of function that she need a higher level of care, further work-up including advanced imaging, and a potential neurological consultation.  All of this I cannot take care of at our facility. I would recommend that you go directly to [facility] emergency department.  I will call them and expect to the charge nurse and let them know that you are coming. As we discussed without a definitive diagnosis, I do not know what I am treating so to just give you medicines is not good medicine.  Please go directly to [facility] ED.    ED Prescriptions    None     PDMP not reviewed this encounter.   Delton See, MD 02/08/21 9021    Delton See, MD 02/11/21 Kristopher Oppenheim

## 2021-02-07 NOTE — Discharge Instructions (Addendum)
As we discussed, my concern is that with 3 days of numbness and tingling going down the right arm and now loss of function that she need a higher level of care, further work-up including advanced imaging, and a potential neurological consultation.  All of this I cannot take care of at our facility. I would recommend that you go directly to [facility] emergency department.  I will call them and expect to the charge nurse and let them know that you are coming. As we discussed without a definitive diagnosis, I do not know what I am treating so to just give you medicines is not good medicine.  Please go directly to [facility] ED.

## 2021-02-07 NOTE — ED Triage Notes (Signed)
Pt c/o right arm pain, swelling, numbness/tingling, and unable to use her arm. Started about 3 days ago.

## 2021-02-15 ENCOUNTER — Other Ambulatory Visit: Payer: Self-pay

## 2021-02-15 ENCOUNTER — Emergency Department: Payer: BC Managed Care – PPO

## 2021-02-15 ENCOUNTER — Emergency Department
Admission: EM | Admit: 2021-02-15 | Discharge: 2021-02-15 | Disposition: A | Discharge status: Home / Self Care | Payer: BC Managed Care – PPO | Attending: Emergency Medicine | Admitting: Emergency Medicine

## 2021-02-15 DIAGNOSIS — N189 Chronic kidney disease, unspecified: Secondary | ICD-10-CM | POA: Insufficient documentation

## 2021-02-15 DIAGNOSIS — Z79899 Other long term (current) drug therapy: Secondary | ICD-10-CM | POA: Diagnosis not present

## 2021-02-15 DIAGNOSIS — Z7982 Long term (current) use of aspirin: Secondary | ICD-10-CM | POA: Insufficient documentation

## 2021-02-15 DIAGNOSIS — R29898 Other symptoms and signs involving the musculoskeletal system: Secondary | ICD-10-CM

## 2021-02-15 DIAGNOSIS — I129 Hypertensive chronic kidney disease with stage 1 through stage 4 chronic kidney disease, or unspecified chronic kidney disease: Secondary | ICD-10-CM | POA: Insufficient documentation

## 2021-02-15 DIAGNOSIS — R531 Weakness: Secondary | ICD-10-CM | POA: Diagnosis not present

## 2021-02-15 DIAGNOSIS — R200 Anesthesia of skin: Secondary | ICD-10-CM | POA: Insufficient documentation

## 2021-02-15 DIAGNOSIS — R55 Syncope and collapse: Secondary | ICD-10-CM | POA: Diagnosis not present

## 2021-02-15 LAB — URINE DRUG SCREEN, QUALITATIVE (ARMC ONLY)
Amphetamines, Ur Screen: NOT DETECTED
Barbiturates, Ur Screen: NOT DETECTED
Benzodiazepine, Ur Scrn: POSITIVE — AB
Cannabinoid 50 Ng, Ur ~~LOC~~: NOT DETECTED
Cocaine Metabolite,Ur ~~LOC~~: NOT DETECTED
MDMA (Ecstasy)Ur Screen: NOT DETECTED
Methadone Scn, Ur: NOT DETECTED
Opiate, Ur Screen: POSITIVE — AB
Phencyclidine (PCP) Ur S: NOT DETECTED
Tricyclic, Ur Screen: NOT DETECTED

## 2021-02-15 LAB — URINALYSIS, COMPLETE (UACMP) WITH MICROSCOPIC
Bilirubin Urine: NEGATIVE
Glucose, UA: NEGATIVE mg/dL
Hgb urine dipstick: NEGATIVE
Ketones, ur: NEGATIVE mg/dL
Leukocytes,Ua: NEGATIVE
Nitrite: NEGATIVE
Protein, ur: NEGATIVE mg/dL
Specific Gravity, Urine: 1.006 (ref 1.005–1.030)
pH: 5 (ref 5.0–8.0)

## 2021-02-15 LAB — POC URINE PREG, ED: Preg Test, Ur: NEGATIVE

## 2021-02-15 LAB — COMPREHENSIVE METABOLIC PANEL
ALT: 28 U/L (ref 0–44)
AST: 27 U/L (ref 15–41)
Albumin: 4.5 g/dL (ref 3.5–5.0)
Alkaline Phosphatase: 85 U/L (ref 38–126)
Anion gap: 9 (ref 5–15)
BUN: 10 mg/dL (ref 6–20)
CO2: 23 mmol/L (ref 22–32)
Calcium: 9.2 mg/dL (ref 8.9–10.3)
Chloride: 108 mmol/L (ref 98–111)
Creatinine, Ser: 0.74 mg/dL (ref 0.44–1.00)
GFR, Estimated: >60 mL/min (ref >60)
Glucose, Bld: 94 mg/dL (ref 70–99)
Potassium: 4.1 mmol/L (ref 3.5–5.1)
Sodium: 140 mmol/L (ref 135–145)
Total Bilirubin: 0.5 mg/dL (ref 0.3–1.2)
Total Protein: 7.4 g/dL (ref 6.5–8.1)

## 2021-02-15 LAB — SALICYLATE LEVEL: Salicylate Lvl: <7 mg/dL — ABNORMAL LOW (ref 7.0–30.0)

## 2021-02-15 LAB — CBC
HCT: 38.7 % (ref 36.0–46.0)
Hemoglobin: 12.8 g/dL (ref 12.0–15.0)
MCH: 27.7 pg (ref 26.0–34.0)
MCHC: 33.1 g/dL (ref 30.0–36.0)
MCV: 83.8 fL (ref 80.0–100.0)
Platelets: 263 K/uL (ref 150–400)
RBC: 4.62 MIL/uL (ref 3.87–5.11)
RDW: 13.5 % (ref 11.5–15.5)
WBC: 9.7 K/uL (ref 4.0–10.5)
nRBC: 0 % (ref 0.0–0.2)

## 2021-02-15 LAB — ACETAMINOPHEN LEVEL: Acetaminophen (Tylenol), Serum: <10 ug/mL — ABNORMAL LOW (ref 10–30)

## 2021-02-15 LAB — APTT: aPTT: 28 seconds (ref 24–36)

## 2021-02-15 LAB — PROTIME-INR
INR: 1 (ref 0.8–1.2)
Prothrombin Time: 12.9 s (ref 11.4–15.2)

## 2021-02-15 LAB — TROPONIN I (HIGH SENSITIVITY): Troponin I (High Sensitivity): 3 ng/L (ref <18)

## 2021-02-15 LAB — ETHANOL: Alcohol, Ethyl (B): <10 mg/dL (ref <10)

## 2021-02-15 MED ORDER — LORAZEPAM 2 MG/ML IJ SOLN: 1.0000 mg | INTRAMUSCULAR | Status: DC | PRN

## 2021-02-15 MED ORDER — LORAZEPAM 1 MG PO TABS: 1.0000 mg | ORAL_TABLET | ORAL | Status: DC | PRN

## 2021-02-15 NOTE — ED Triage Notes (Signed)
Pt to ED POV for syncopal episode approx 3 days ago. Pt reports after episode she had no use of right arm. Weakness noted to right arm. No weakness to right leg. Pt ambulatory to triage, alert and oriented.  Reports headache and stiff neck  Pt has chronic pain disorder.   Pt reports SI, states this is normal for her. Endorses plan Pt very tearful in triage.   Discussed pt with Dr Larinda Buttery

## 2021-02-15 NOTE — ED Notes (Signed)
Pt given orajel out bag. Used for x1app at this time OK per Dr. Larinda Buttery.

## 2021-02-15 NOTE — ED Notes (Signed)
MD Jessup at bedside 

## 2021-02-15 NOTE — ED Notes (Signed)
Pt states syncopal episode a couple of weeks ago after which she woke up and was unable to use her right hand. Pt states that she waited and saw her PCP and UC and they both referred her here. Pt has function in her right arm but weak grip in her hand compared to left. Has chronic pain syndrome and states that it has gotten worse as well as a headache with neck pain. Pt was dressed out due to being chronically SI. States "you try having chronic pain syndrome and not being able to work for the past umteen years and see how you feel". States an attempt at 45 years old but not one since. Is seeing psychiatry.

## 2021-02-15 NOTE — ED Provider Notes (Signed)
[facility] Emergency Department Provider Note   ____________________________________________   Event Date/Time   First MD Initiated Contact with Patient 02/15/21 1617     (approximate)  I have reviewed the triage vital signs and the nursing notes.   HISTORY  Chief Complaint Weakness    HPI Tina Donovan is a 45 y.o. female with past medical history of complex regional pain syndrome, chronic Lyme disease, hypertension, and chronic pancreatitis who presents to the ED complaining of weakness.  Patient reports that she had a syncopal episode last Monday and when she woke up she was having a hard time using her right hand.  She states she can move her right shoulder and right elbow normally but has numbness and weakness in her right hand and wrist.  She reports some pain coming down her right arm and in her neck, although she deals with chronic pain affecting her entire body.  She has a spinal cord stimulator and receives monthly doses of ketamine through a Mediport from her pain management doctor in [place].  Her recent symptoms have been associated with headache, but she denies any fevers or neck stiffness.  She has not had any vision changes, speech changes, or other areas of weakness.  She was seen at urgent care 1 week ago and advised to seek care in the ED at that time.  She reports intermittent episodes of "blacking out" over the past couple of weeks, but denies any pain in her chest or trouble breathing.        Past Medical History:  Diagnosis Date  . Anxiety   . Arthritis    Rheumatoid  . Bartonella infection   . Chronic kidney disease   . Chronic pain    CRPS  . Chronic pancreatitis (HCC)   . Complication of anesthesia    Pt wakes up during procedures  . Constipation   . Depression   . GERD (gastroesophageal reflux disease)   . Headache    migraines - 2-3x/mo  . Hypertension   . Kidney stone   . Post-Lyme disease syndrome   .  Pyelonephritis   . Rectal prolapse   . Small fiber neuropathy     Patient Active Problem List   Diagnosis Date Noted  . Near syncope 09/06/2016  . Syncope and collapse 09/06/2016  . Nausea   . Right ovarian cyst 08/03/2016  . Chronic constipation 03/14/2016  . Chronic pain 03/14/2016  . Chronic pancreatitis (HCC) 03/14/2016  . Complex regional pain syndrome type 2 of upper extremity 03/14/2016  . Personal history of urinary infection 03/14/2016  . Calculus of kidney 03/14/2016  . Sequelae of other specified infectious and parasitic diseases 03/14/2016  . 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 Rheumatoid arthritis involving multiple joints (HCC) 03/14/2016  . Small fiber neuropathy 03/14/2016  . Abdominal pain, epigastric 07/14/2014  . Hereditary and idiopathic neuropathy 05/28/2014  . H/O urinary tract infection 05/28/2014  . Infectious or parasitic disease, late effect 05/28/2014  . Ache in joint 10/20/2013  . Complex regional pain syndrome 03/12/2013  . 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 Avitaminosis D 01/07/2012  . Anxiety 11/08/2011  . Clinical depression 11/08/2011    Past Surgical History:  Procedure Laterality Date  . CHOLECYSTECTOMY  2005   [facility]  . COLONOSCOPY  1998  . CYSTOSCOPY W/ SPINCTEROTOMY  2007  . ESOPHAGOGASTRODUODENOSCOPY (EGD) WITH PROPOFOL N/A 08/06/2016   Procedure: ESOPHAGOGASTRODUODENOSCOPY (EGD) WITH PROPOFOL;  Surgeon: Midge Minium,  MD;  Location: [facility];  Service: Endoscopy;  Laterality: N/A;  . ESOPHAGOGASTRODUODENOSCOPY ENDOSCOPY  2005   2007, 2009 and 2016  . KNEE SURGERY  1998  . PORTACATH PLACEMENT  2005  . SPINAL CORD STIMULATOR INSERTION  2009   2017 - currently turned off - pt not using    Prior to Admission medications   Medication Sig Start Date End Date Taking? Authorizing Provider  ALPRAZolam Prudy Feeler) 1 MG tablet 1 tablet. 03/11/16   [provider]  Ascorbic Acid (VITAMIN C) 100  MG tablet Take 100 mg by mouth daily.    [provider]  aspirin-acetaminophen-caffeine (EXCEDRIN MIGRAINE) [id number] MG tablet Take by mouth.    [provider]  DEXILANT 60 MG capsule TAKE 1 CAPSULE (60 MG TOTAL) BY MOUTH DAILY. 05/13/17   Midge Minium, MD  diazepam (VALIUM) 5 MG tablet Take 5 mg by mouth.    [provider]  diphenhydrAMINE (BENADRYL) 50 MG/ML injection See admin instructions. 10/06/16   [provider]  Emollient (COCONUT OIL BEAUTY) CREA Apply topically.    [provider]  HYDROmorphone (DILAUDID) 8 MG tablet Take 8 mg by mouth. 03/10/15   [provider]  levonorgestrel (MIRENA) 20 MCG/24HR IUD 1 each by Intrauterine route once.    [provider]  linaclotide Karlene Einstein) 290 MCG CAPS capsule Take 1 capsule (290 mcg total) by mouth daily before breakfast. 03/21/17   Midge Minium, MD  magnesium hydroxide (MILK OF MAGNESIA) 400 MG/5ML suspension Take by mouth.    [provider]  morphine (MS CONTIN) 60 MG 12 hr tablet Take 1 tablet by mouth. 02/21/16   [provider]  nitroGLYCERIN (NITROSTAT) 0.3 MG SL tablet 0.3 mg. 01/07/12   [provider]  ondansetron (ZOFRAN) 8 MG tablet TAKE 1 TABLET UP TO 3 TIMES DAILY AS NEEDED 06/12/16   [provider]  ondansetron (ZOFRAN-ODT) 8 MG disintegrating tablet 8 mg.    [provider]  Oxycodone HCl 20 MG TABS Take 1 tablet by mouth. 03/04/16   [provider]  polyethylene glycol (MIRALAX / GLYCOLAX) packet Take 17 g by mouth daily.    [provider]  promethazine (PHENERGAN) 25 MG tablet Take 25 mg by mouth 2 (two) times daily as needed. 01/10/16   [provider]  propranolol ER (INDERAL LA) 60 MG 24 hr capsule  09/11/15   [provider]  tiZANidine (ZANAFLEX) 4 MG tablet Take 4-8 mg by mouth. 12/28/14   [provider]  traZODone (DESYREL) 150 MG tablet Take 150 mg by mouth. 03/15/14   [provider]  zolpidem (AMBIEN) 10 MG tablet  06/21/15   [provider]    Allergies Duloxetine, Levetiracetam, Levofloxacin, and Other  Family History  Problem Relation Age of Onset  . Alcohol abuse Father   . Heart disease Father   . Dementia Father   . Cancer Mother        breast and uterine    Social History Social History   Tobacco Use  . Smoking status: Never Smoker  . Smokeless tobacco: Never Used  Vaping Use  . Vaping Use: Never used  Substance Use Topics  . Alcohol use: No    Alcohol/week: 0.0 standard drinks  . Drug use: No    Review of Systems  Constitutional: No fever/chills Eyes: No visual changes. ENT: No sore throat. Cardiovascular: Denies chest pain.  Positive for syncope and palpitations. Respiratory: Denies shortness of breath. Gastrointestinal: No  abdominal pain.  No nausea, no vomiting.  No diarrhea.  No constipation. Genitourinary: Negative for dysuria. Musculoskeletal: Negative for back pain.  Positive for neck pain. Skin: Negative for rash. Neurological: Positive for headaches, right hand numbness and weakness.  ____________________________________________   PHYSICAL EXAM:  VITAL SIGNS: ED Triage Vitals  Enc Vitals Group     BP 02/15/21 1558 (!) 202/132     Pulse Rate 02/15/21 1558 75     Resp 02/15/21 1558 20     Temp 02/15/21 1558 98.7 F (37.1 C)     Temp Source 02/15/21 1558 Oral     SpO2 02/15/21 1558 100 %     Weight 02/15/21 1559 120 lb (54.4 kg)     Height 02/15/21 1559 5\' 4"  (1.626 m)     Head Circumference --      Peak Flow --      Pain Score --      Pain Loc --      Pain Edu? --      Excl. in GC? --     Constitutional: Alert and oriented. Eyes: Conjunctivae are normal.  Pupils equal round and reactive to light bilaterally. Head: Atraumatic. Nose: No congestion/rhinnorhea. Mouth/Throat: Mucous membranes are moist. Neck: Normal ROM, midline cervical spine tenderness noted at base of  C-spine. Cardiovascular: Normal rate, regular rhythm. Grossly normal heart sounds. Respiratory: Normal respiratory effort.  No retractions. Lungs CTAB. Gastrointestinal: Soft and nontender. No distention. Genitourinary: deferred Musculoskeletal: No lower extremity tenderness nor edema. Neurologic:  Normal speech and language.  Significant weakness noted with flexion and extension of right wrist along with grip on the right. Skin:  Skin is warm, dry and intact. No rash noted. Psychiatric: Mood and affect are normal. Speech and behavior are normal.  ____________________________________________   LABS (all labs ordered are listed, but only abnormal results are displayed)  Labs Reviewed  SALICYLATE LEVEL - Abnormal; Notable for the following components:      Result Value   Salicylate Lvl <7.0 (*)    All other components within normal limits  ACETAMINOPHEN LEVEL - Abnormal; Notable for the following components:   Acetaminophen (Tylenol), Serum <10 (*)    All other components within normal limits  URINE DRUG SCREEN, QUALITATIVE ([facility] ONLY) - Abnormal; Notable for the following components:   Opiate, Ur Screen POSITIVE (*)    Benzodiazepine, Ur Scrn POSITIVE (*)    All other components within normal limits  URINALYSIS, COMPLETE (UACMP) WITH MICROSCOPIC - Abnormal; Notable for the following components:   Color, Urine STRAW (*)    APPearance CLEAR (*)    Bacteria, UA MANY (*)    All other components within normal limits  COMPREHENSIVE METABOLIC PANEL  ETHANOL  CBC  PROTIME-INR  APTT  POC URINE PREG, ED  CBG MONITORING, ED  TROPONIN I (HIGH SENSITIVITY)   ____________________________________________  EKG  ED ECG REPORT I, , the attending physician, personally viewed and interpreted this ECG.   Date: 02/15/2021  EKG Time: 16:01  Rate: 80  Rhythm: normal sinus rhythm  Axis: Normal  Intervals:none  ST&T Change: None   PROCEDURES  Procedure(s) performed  (including Critical Care):  Procedures   ____________________________________________   INITIAL IMPRESSION / ASSESSMENT AND PLAN / ED COURSE       45 year old female with past medical history of complex regional pain syndrome, chronic Lyme disease, hypertension, and chronic pancreatitis who presents to the ED for multiple syncopal episodes after which she has noticed numbness and weakness in her  right hand.  She does appear to have decreased strength and sensation in her hand however strength is intact in her right shoulder and elbow.  CT head reviewed by me and shows no obvious hemorrhage, negative for acute process per radiology.  EKG shows no evidence of arrhythmia or ischemia and additional labs are unremarkable.  Low suspicion for cardiac etiology for her syncopal episodes but we will need to further assess for stroke or cervical pathology with MRI.  Case discussed with Dr. Derry Lory of neurology, who agrees that if MRIs are negative patient may follow-up with outpatient neurologist for possible peripheral neuropathy.  Patient additionally reports chronic thoughts of suicide with no specific plan.  She states she has no desire to follow through on these thoughts and she does not want to leave her dog alone.  She was offered psychiatric evaluation but declines and I do not feel there is indication for IVC at this time.  Patient has a spinal cord stimulator that has not been in use for multiple years, although we will need to be confirmed to have been turned off prior to MRI.  Patient states that she needs to charge the stimulator in order to turn it off, but does not want to stay in the ED in order for this to happen.  She is requesting to be discharged home so that she can charge the spinal cord stimulator and then either return to the ED or follow-up with her neurologist for MRI.  Patient expresses understanding that we cannot rule out stroke or acute cervical pathology without MRI and she  still wishes to leave the hospital at this time.  She was counseled to return to the ED for further work-up for any worsening symptoms, otherwise follow-up with neurology.      ____________________________________________   FINAL CLINICAL IMPRESSION(S) / ED DIAGNOSES  Final diagnoses:  Right arm weakness  Syncope, unspecified syncope type     ED Discharge Orders    None       Note:  This document was prepared using Dragon voice recognition software and may include unintentional dictation errors.   Chesley Noon, MD 02/16/21 [id number]

## 2021-02-15 NOTE — ED Notes (Signed)
Pt has spinal stimulator. Per MRI, has to be turned off with remote. Husband to bring remote then can go to MRI.

## 2021-02-15 NOTE — ED Notes (Signed)
Reviewed discharge instructions and follow-up care with patient. Patient verbalized understanding of all information reviewed. Patient stable, with no distress noted at this time.    

## 2021-02-15 NOTE — ED Notes (Signed)
Pt dressed out with this RN and [place] NT. Belongings include: Lexicographer top Cell phone Blue shoes Jeans Hat Purse   Pt has briefs on

## 2021-02-16 ENCOUNTER — Telehealth: Payer: Self-pay | Admitting: Cardiology

## 2021-02-16 NOTE — Telephone Encounter (Signed)
Patient is calling to ask when she needs to have MRI of brain and spine. States she discussed this yesterday with DR. Agbor-Etang. Patient states she has a [employer] spinal cord stimulator that has been turned off for several years. Please call to discuss.

## 2021-02-23 NOTE — Telephone Encounter (Signed)
Patient is not a patient of Dr. Merita Norton and he stated that he has not spoke with her prior.

## 2021-03-10 ENCOUNTER — Encounter: Payer: Self-pay | Admitting: Emergency Medicine

## 2021-03-10 ENCOUNTER — Emergency Department
Admission: EM | Admit: 2021-03-10 | Discharge: 2021-03-10 | Disposition: A | Discharge status: Home / Self Care | Payer: BC Managed Care – PPO | Attending: Emergency Medicine | Admitting: Emergency Medicine

## 2021-03-10 ENCOUNTER — Other Ambulatory Visit: Payer: Self-pay

## 2021-03-10 DIAGNOSIS — I129 Hypertensive chronic kidney disease with stage 1 through stage 4 chronic kidney disease, or unspecified chronic kidney disease: Secondary | ICD-10-CM | POA: Diagnosis not present

## 2021-03-10 DIAGNOSIS — Z23 Encounter for immunization: Secondary | ICD-10-CM | POA: Insufficient documentation

## 2021-03-10 DIAGNOSIS — Z79899 Other long term (current) drug therapy: Secondary | ICD-10-CM | POA: Diagnosis not present

## 2021-03-10 DIAGNOSIS — N189 Chronic kidney disease, unspecified: Secondary | ICD-10-CM | POA: Diagnosis not present

## 2021-03-10 DIAGNOSIS — W260XXA Contact with knife, initial encounter: Secondary | ICD-10-CM | POA: Insufficient documentation

## 2021-03-10 DIAGNOSIS — S61412A Laceration without foreign body of left hand, initial encounter: Secondary | ICD-10-CM

## 2021-03-10 DIAGNOSIS — S6992XA Unspecified injury of left wrist, hand and finger(s), initial encounter: Secondary | ICD-10-CM | POA: Diagnosis present

## 2021-03-10 DIAGNOSIS — Z7982 Long term (current) use of aspirin: Secondary | ICD-10-CM | POA: Insufficient documentation

## 2021-03-10 MED ORDER — TETANUS-DIPHTH-ACELL PERTUSSIS 5-2.5-18.5 LF-MCG/0.5 IM SUSY
0.5000 mL | PREFILLED_SYRINGE | Freq: Once | INTRAMUSCULAR | Status: AC
  Administered 2021-03-10: 0.5 mL via INTRAMUSCULAR
  Filled 2021-03-10: qty 0.5

## 2021-03-10 MED ORDER — IBUPROFEN 800 MG PO TABS
800.0000 mg | ORAL_TABLET | Freq: Once | ORAL | Status: AC
  Administered 2021-03-10: 800 mg via ORAL
  Filled 2021-03-10: qty 1

## 2021-03-10 MED ORDER — LIDOCAINE-EPINEPHRINE (PF) 2 %-1:200000 IJ SOLN
10.0000 mL | Freq: Once | INTRAMUSCULAR | Status: AC
  Administered 2021-03-10: 10 mL via INTRADERMAL
  Filled 2021-03-10: qty 10

## 2021-03-10 MED ORDER — BACITRACIN ZINC 500 UNIT/GM EX OINT
TOPICAL_OINTMENT | Freq: Once | CUTANEOUS | Status: AC
  Filled 2021-03-10: qty 0.9

## 2021-03-10 NOTE — ED Notes (Signed)
Bacitracin applied to sutured wound by Ward MD. Xeroform and gauze applied to wound.

## 2021-03-10 NOTE — Discharge Instructions (Addendum)
You may alternate Tylenol 1000 mg every 6 hours as needed for pain, fever and Ibuprofen 800 mg every 8 hours as needed for pain, fever.  Please take Ibuprofen with food.  Do not take more than 4000 mg of Tylenol (acetaminophen) in a 24 hour period.  

## 2021-03-10 NOTE — ED Provider Notes (Signed)
[facility] Emergency Department Provider Note  ____________________________________________   Event Date/Time   First MD Initiated Contact with Patient 03/10/21 0214     (approximate)  I have reviewed the triage vital signs and the nursing notes.   HISTORY  Chief Complaint Laceration    HPI Tina Donovan is a 45 y.o. female right-hand-dominant with history of hypertension, CRPS who presents to the emergency department after she accidentally cut her left hand with a box cutter tonight.  Denies that this injury was intentional or that anyone else did this to her.  She does have bruising noted to her jaw bilaterally which she states is from recently having teeth pulled.  No other injury today.  Unsure of last tetanus vaccination.  Patient works as a International aid/development worker.        Past Medical History:  Diagnosis Date  . Anxiety   . Arthritis    Rheumatoid  . Bartonella infection   . Chronic kidney disease   . Chronic pain    CRPS  . Chronic pancreatitis (HCC)   . Complication of anesthesia    Pt wakes up during procedures  . Constipation   . Depression   . GERD (gastroesophageal reflux disease)   . Headache    migraines - 2-3x/mo  . Hypertension   . Kidney stone   . Post-Lyme disease syndrome   . Pyelonephritis   . Rectal prolapse   . Small fiber neuropathy     Patient Active Problem List   Diagnosis Date Noted  . Near syncope 09/06/2016  . Syncope and collapse 09/06/2016  . Nausea   . Right ovarian cyst 08/03/2016  . Chronic constipation 03/14/2016  . Chronic pain 03/14/2016  . Chronic pancreatitis (HCC) 03/14/2016  . Complex regional pain syndrome type 2 of upper extremity 03/14/2016  . Personal history of urinary infection 03/14/2016  . Calculus of kidney 03/14/2016  . Sequelae of other specified infectious and parasitic diseases 03/14/2016  . 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 Rheumatoid arthritis  involving multiple joints (HCC) 03/14/2016  . Small fiber neuropathy 03/14/2016  . Abdominal pain, epigastric 07/14/2014  . Hereditary and idiopathic neuropathy 05/28/2014  . H/O urinary tract infection 05/28/2014  . Infectious or parasitic disease, late effect 05/28/2014  . Ache in joint 10/20/2013  . Complex regional pain syndrome 03/12/2013  . 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 Avitaminosis D 01/07/2012  . Anxiety 11/08/2011  . Clinical depression 11/08/2011    Past Surgical History:  Procedure Laterality Date  . CHOLECYSTECTOMY  2005   [facility]  . COLONOSCOPY  1998  . CYSTOSCOPY W/ SPINCTEROTOMY  2007  . ESOPHAGOGASTRODUODENOSCOPY (EGD) WITH PROPOFOL N/A 08/06/2016   Procedure: ESOPHAGOGASTRODUODENOSCOPY (EGD) WITH PROPOFOL;  Surgeon: Midge Minium, MD;  Location: [facility];  Service: Endoscopy;  Laterality: N/A;  . ESOPHAGOGASTRODUODENOSCOPY ENDOSCOPY  2005   2007, 2009 and 2016  . KNEE SURGERY  1998  . PORTACATH PLACEMENT  2005  . SPINAL CORD STIMULATOR INSERTION  2009   2017 - currently turned off - pt not using    Prior to Admission medications   Medication Sig Start Date End Date Taking? Authorizing Provider  ALPRAZolam Prudy Feeler) 1 MG tablet 1 tablet. 03/11/16   [provider]  Ascorbic Acid (VITAMIN C) 100 MG tablet Take 100 mg by mouth daily.    [provider]  aspirin-acetaminophen-caffeine (EXCEDRIN MIGRAINE) [phone] MG tablet Take by mouth.    [provider]  Raelyn Mora  60 MG capsule TAKE 1 CAPSULE (60 MG TOTAL) BY MOUTH DAILY. 05/13/17   Midge Minium, MD  diazepam (VALIUM) 5 MG tablet Take 5 mg by mouth.    [provider]  diphenhydrAMINE (BENADRYL) 50 MG/ML injection See admin instructions. 10/06/16   [provider]  Emollient (COCONUT OIL BEAUTY) CREA Apply topically.    [provider]  HYDROmorphone (DILAUDID) 8 MG tablet Take 8 mg by mouth. 03/10/15   [provider]  levonorgestrel (MIRENA) 20  MCG/24HR IUD 1 each by Intrauterine route once.    [provider]  linaclotide Karlene Einstein) 290 MCG CAPS capsule Take 1 capsule (290 mcg total) by mouth daily before breakfast. 03/21/17   Midge Minium, MD  magnesium hydroxide (MILK OF MAGNESIA) 400 MG/5ML suspension Take by mouth.    [provider]  morphine (MS CONTIN) 60 MG 12 hr tablet Take 1 tablet by mouth. 02/21/16   [provider]  nitroGLYCERIN (NITROSTAT) 0.3 MG SL tablet 0.3 mg. 01/07/12   [provider]  ondansetron (ZOFRAN) 8 MG tablet TAKE 1 TABLET UP TO 3 TIMES DAILY AS NEEDED 06/12/16   [provider]  ondansetron (ZOFRAN-ODT) 8 MG disintegrating tablet 8 mg.    [provider]  Oxycodone HCl 20 MG TABS Take 1 tablet by mouth. 03/04/16   [provider]  polyethylene glycol (MIRALAX / GLYCOLAX) packet Take 17 g by mouth daily.    [provider]  promethazine (PHENERGAN) 25 MG tablet Take 25 mg by mouth 2 (two) times daily as needed. 01/10/16   [provider]  propranolol ER (INDERAL LA) 60 MG 24 hr capsule  09/11/15   [provider]  tiZANidine (ZANAFLEX) 4 MG tablet Take 4-8 mg by mouth. 12/28/14   [provider]  traZODone (DESYREL) 150 MG tablet Take 150 mg by mouth. 03/15/14   [provider]  zolpidem (AMBIEN) 10 MG tablet  06/21/15   [provider]    Allergies Duloxetine, Levetiracetam, Levofloxacin, Other, and Ceftriaxone  Family History  Problem Relation Age of Onset  . Alcohol abuse Father   . Heart disease Father   . Dementia Father   . Cancer Mother        breast and uterine    Social History Social History   Tobacco Use  . Smoking status: Never Smoker  . Smokeless tobacco: Never Used  Vaping Use  . Vaping Use: Never used  Substance Use Topics  . Alcohol use: No    Alcohol/week: 0.0 standard drinks  . Drug use: No    Review of Systems Constitutional: No fever. Eyes: No visual  changes. ENT: No sore throat. Cardiovascular: Denies chest pain. Respiratory: Denies shortness of breath. Gastrointestinal: No nausea, vomiting, diarrhea. Genitourinary: Negative for dysuria. Musculoskeletal: Negative for back pain. Skin: Negative for rash. Neurological: Negative for focal weakness or numbness.  ____________________________________________   PHYSICAL EXAM:  VITAL SIGNS: ED Triage Vitals  Enc Vitals Group     BP 03/10/21 0034 102/74     Pulse Rate 03/10/21 0034 77     Resp 03/10/21 0034 18     Temp 03/10/21 0034 99 F (37.2 C)     Temp Source 03/10/21 0034 Oral     SpO2 03/10/21 0034 100 %     Weight 03/10/21 0035 120 lb (54.4 kg)     Height 03/10/21 0035 5\' 4"  (1.626 m)     Head Circumference --      Peak Flow --  Pain Score 03/10/21 0034 8     Pain Loc --      Pain Edu? --      Excl. in GC? --    CONSTITUTIONAL: Alert and responds appropriately to questions. Well-appearing; well-nourished, extremely pleasant HEAD: Normocephalic, atraumatic EYES: Conjunctivae clear, pupils appear equal ENT: normal nose; moist mucous membranes NECK: Normal range of motion CARD: Regular rate and rhythm RESP: Normal chest excursion without splinting or tachypnea; no hypoxia or respiratory distress, speaking full sentences ABD/GI: non-distended EXT: Normal ROM in all joints, no major deformities noted; patient has an approximately 5 cm superficial laceration just proximal to the fourth and fifth digits on the palm of her left hand with no foreign body, tendon damage.  There is a small arterial bleed that was stopped after pressure and lidocaine with epinephrine.  2+ left radial pulse on exam.  Reports diminished sensation in the left fifth digit but normal capillary refill.  Patient is able to fully flex and extend all digits of the left hand. SKIN: Normal color for age and race, no rashes on exposed skin NEURO: Moves all extremities equally, normal speech, no facial  asymmetry noted PSYCH: The patient's mood and manner are appropriate. Grooming and personal hygiene are appropriate.  ____________________________________________   LABS (all labs ordered are listed, but only abnormal results are displayed)  Labs Reviewed - No data to display ____________________________________________  EKG   ____________________________________________  RADIOLOGY I, Jenisse Vullo, personally viewed and evaluated these images (plain radiographs) as part of my medical decision making, as well as reviewing the written report by the radiologist.  ED MD interpretation:    Official radiology report(s): No results found.  ____________________________________________   PROCEDURES  Procedure(s) performed (including Critical Care):  Procedures  LACERATION REPAIR Performed by: Rochele Raring Authorized by: Rochele Raring Consent: Verbal consent obtained. Risks and benefits: risks, benefits and alternatives were discussed Consent given by: patient Patient identity confirmed: provided demographic data Prepped and Draped in normal sterile fashion Wound explored  Laceration Location: Left palm  Laceration Length: 5cm  No Foreign Bodies seen or palpated  Anesthesia: local infiltration  Local anesthetic: lidocaine 2% with epinephrine  Anesthetic total: 7 ml  Irrigation method: syringe Amount of cleaning: standard  Skin closure: Superficial  Number of sutures: 9  Technique: Area anesthetized using lidocaine 2% with epinephrine. Wound irrigated copiously with sterile saline. Wound then cleaned with Betadine and draped in sterile fashion. Wound closed using 9 simple interrupted sutures with 5-0 Prolene.  Bacitracin and sterile dressing applied. Good wound approximation and hemostasis achieved.   Patient tolerance: Patient tolerated the procedure well with no immediate complications.   ____________________________________________   INITIAL IMPRESSION /  ASSESSMENT AND PLAN / ED COURSE  As part of my medical decision making, I reviewed the following data within the electronic MEDICAL RECORD NUMBER Nursing notes reviewed and incorporated, Old chart reviewed and Notes from prior ED visits         Patient here after accidental laceration to the left hand.  Tetanus vaccination has been updated.  Laceration repaired.  Bacitracin and sterile dressing applied.  Discussed wound care instructions, return precautions.  Recommended alternating Tylenol, Motrin as needed for pain.  No tendon involvement seen.  No foreign body.  Hemostatic on discharge.   At this time, I do not feel there is any life-threatening condition present. I have reviewed, interpreted and discussed all results (EKG, imaging, lab, urine as appropriate) and exam findings with patient/family. I have reviewed nursing notes  and appropriate previous records.  I feel the patient is safe to be discharged home without further emergent workup and can continue workup as an outpatient as needed. Discussed usual and customary return precautions. Patient/family verbalize understanding and are comfortable with this plan.  Outpatient follow-up has been provided as needed. All questions have been answered.   ____________________________________________   FINAL CLINICAL IMPRESSION(S) / ED DIAGNOSES  Final diagnoses:  Laceration of left hand without foreign body, initial encounter     ED Discharge Orders    None      *Please note:  Tina Donovan was evaluated in Emergency Department on 03/10/2021 for the symptoms described in the history of present illness. She was evaluated in the context of the global COVID-19 pandemic, which necessitated consideration that the patient might be at risk for infection with the SARS-CoV-2 virus that causes COVID-19. Institutional protocols and algorithms that pertain to the evaluation of patients at risk for COVID-19 are in a state of rapid change based on information  released by regulatory bodies including the CDC and federal and state organizations. These policies and algorithms were followed during the patient's care in the ED.  Some ED evaluations and interventions may be delayed as a result of limited staffing during and the pandemic.*   Note:  This document was prepared using Dragon voice recognition software and may include unintentional dictation errors.   Dayleen Beske, Layla Maw, DO 03/10/21 [phone]

## 2021-03-10 NOTE — ED Triage Notes (Addendum)
Pt states she was cutting hair clips with box cutters tonight cut her L palm under fingers. Unknown when last tetanus shot was. Bleeding controlled at this time, hand wrapped in gauze.

## 2021-07-25 DIAGNOSIS — R300 Dysuria: Secondary | ICD-10-CM | POA: Insufficient documentation

## 2021-07-25 DIAGNOSIS — N898 Other specified noninflammatory disorders of vagina: Secondary | ICD-10-CM | POA: Insufficient documentation

## 2022-08-10 ENCOUNTER — Encounter: Payer: Self-pay | Admitting: Emergency Medicine

## 2022-08-10 ENCOUNTER — Ambulatory Visit
Admission: EM | Admit: 2022-08-10 | Discharge: 2022-08-10 | Disposition: A | Discharge status: Home / Self Care | Payer: BC Managed Care – PPO | Attending: Emergency Medicine | Admitting: Emergency Medicine

## 2022-08-10 ENCOUNTER — Ambulatory Visit (INDEPENDENT_AMBULATORY_CARE_PROVIDER_SITE_OTHER): Payer: BC Managed Care – PPO

## 2022-08-10 DIAGNOSIS — W19XXXA Unspecified fall, initial encounter: Secondary | ICD-10-CM

## 2022-08-10 DIAGNOSIS — M79671 Pain in right foot: Secondary | ICD-10-CM | POA: Diagnosis not present

## 2022-08-10 DIAGNOSIS — S9031XA Contusion of right foot, initial encounter: Secondary | ICD-10-CM

## 2022-08-10 NOTE — ED Triage Notes (Signed)
Patient states that she fell 2 days ago while she was in her kitchen.  Patient states that she passed out and woke up screaming with pain in her right foot.  Patient unsure if she hit her head.  Patient denies any pain in her head.  Patient reports ongoing pain in her right foot. Patient states that she has ongoing episodes of syncope episodes .  Patient came with her husband.

## 2022-08-10 NOTE — Discharge Instructions (Addendum)
Your x-rays today did not show the presence of any broken bones or dislocated bones.  I believe your swelling is all soft tissue related.  In addition to the pain medication that you already prescribed you can use over-the-counter ibuprofen according to the package instructions as needed for pain and inflammation.  Keep your right foot elevated is much as possible to help decrease swelling and aid in pain relief.  You can apply moist heat to your foot for 20 minutes at a time 2-3 times a day to aid in resolution of swelling.  I also recommend wearing your compression hose to help resolve the swelling.  If the swelling continues, or your pain intensifies, I recommend following up with orthopedics or seeking treatment in the ER for pain control.

## 2022-08-10 NOTE — ED Provider Notes (Addendum)
[facility]    CSN: [id number] Arrival date & time: 08/10/22  1545      History   Chief Complaint Chief Complaint  Patient presents with   Fall   Foot Pain    right    HPI Tina Donovan is a 46 y.o. female.   HPI  46 year old female here for evaluation of right foot pain.  Patient reports that she was standing in her kitchen 2 days ago when she had a syncopal episode and then she woke up with "searing and burning pain"in her right foot.  This occurred 2 days ago and the pain and swelling has continued.  Patient reports the pain is over the distal midfoot and she is also complaining of numbness.  There is a large amount of swelling present as well.  Past Medical History:  Diagnosis Date   Anxiety    Arthritis    Rheumatoid   Bartonella infection    Chronic kidney disease    Chronic pain    CRPS   Chronic pancreatitis (HCC)    Complication of anesthesia    Pt wakes up during procedures   Constipation    Depression    GERD (gastroesophageal reflux disease)    Headache    migraines - 2-3x/mo   Hypertension    Kidney stone    Post-Lyme disease syndrome    Pyelonephritis    Rectal prolapse    Small fiber neuropathy     Patient Active Problem List   Diagnosis Date Noted   Near syncope 09/06/2016   Syncope and collapse 09/06/2016   Nausea    Right ovarian cyst 08/03/2016   Chronic constipation 03/14/2016   Chronic pain 03/14/2016   Chronic pancreatitis (HCC) 03/14/2016   Complex regional pain syndrome type 2 of upper extremity 03/14/2016   Personal history of urinary infection 03/14/2016   Calculus of kidney 03/14/2016   Sequelae of other specified infectious and parasitic diseases 03/14/2016   Procidentia of rectum 03/14/2016   Shoulder-hand syndrome 03/14/2016   Rheumatoid arthritis involving multiple joints (HCC) 03/14/2016   Small fiber neuropathy 03/14/2016   Abdominal pain, epigastric 07/14/2014   Hereditary and idiopathic neuropathy  05/28/2014   H/O urinary tract infection 05/28/2014   Infectious or parasitic disease, late effect 05/28/2014   Ache in joint 10/20/2013   Complex regional pain syndrome 03/12/2013   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   Avitaminosis D 01/07/2012   Anxiety 11/08/2011   Clinical depression 11/08/2011    Past Surgical History:  Procedure Laterality Date   CHOLECYSTECTOMY  2005   [facility]   COLONOSCOPY  1998   CYSTOSCOPY W/ SPINCTEROTOMY  2007   ESOPHAGOGASTRODUODENOSCOPY (EGD) WITH PROPOFOL N/A 08/06/2016   Procedure: ESOPHAGOGASTRODUODENOSCOPY (EGD) WITH PROPOFOL;  Surgeon: Midge Minium, MD;  Location: [facility];  Service: Endoscopy;  Laterality: N/A;   ESOPHAGOGASTRODUODENOSCOPY ENDOSCOPY  2005   2007, 2009 and 2016   KNEE SURGERY  1998   PORTACATH PLACEMENT  2005   SPINAL CORD STIMULATOR INSERTION  2009   2017 - currently turned off - pt not using    OB History   No obstetric history on file.      Home Medications    Prior to Admission medications   Medication Sig Start Date End Date Taking? Authorizing Provider  ALPRAZolam Prudy Feeler) 1 MG tablet 1 tablet. 03/11/16  Yes [provider]  DEXILANT 60 MG capsule TAKE 1 CAPSULE (60 MG TOTAL) BY MOUTH DAILY. 05/13/17  Yes Midge Minium, MD  diazepam (VALIUM) 5 MG tablet Take 5 mg by mouth.   Yes [provider]  HYDROmorphone (DILAUDID) 8 MG tablet Take 8 mg by mouth. 03/10/15  Yes [provider]  levonorgestrel (MIRENA) 20 MCG/24HR IUD 1 each by Intrauterine route once.   Yes [provider]  linaclotide Karlene Einstein) 290 MCG CAPS capsule Take 1 capsule (290 mcg total) by mouth daily before breakfast. 03/21/17  Yes Midge Minium, MD  Oxycodone HCl 20 MG TABS Take 1 tablet by mouth. 03/04/16  Yes [provider]  propranolol ER (INDERAL LA) 60 MG 24 hr capsule  09/11/15  Yes [provider]  tiZANidine (ZANAFLEX) 4 MG tablet Take 4-8 mg by mouth. 12/28/14  Yes [provider]   Ascorbic Acid (VITAMIN C) 100 MG tablet Take 100 mg by mouth daily.    [provider]  aspirin-acetaminophen-caffeine (EXCEDRIN MIGRAINE) [id number] MG tablet Take by mouth.    [provider]  diphenhydrAMINE (BENADRYL) 50 MG/ML injection See admin instructions. 10/06/16   [provider]  Emollient (COCONUT OIL BEAUTY) CREA Apply topically.    [provider]  magnesium hydroxide (MILK OF MAGNESIA) 400 MG/5ML suspension Take by mouth.    [provider]  morphine (MS CONTIN) 60 MG 12 hr tablet Take 1 tablet by mouth. 02/21/16   [provider]  nitroGLYCERIN (NITROSTAT) 0.3 MG SL tablet 0.3 mg. 01/07/12   [provider]  ondansetron (ZOFRAN) 8 MG tablet TAKE 1 TABLET UP TO 3 TIMES DAILY AS NEEDED 06/12/16   [provider]  ondansetron (ZOFRAN-ODT) 8 MG disintegrating tablet 8 mg.    [provider]  polyethylene glycol (MIRALAX / GLYCOLAX) packet Take 17 g by mouth daily.    [provider]  promethazine (PHENERGAN) 25 MG tablet Take 25 mg by mouth 2 (two) times daily as needed. 01/10/16   [provider]  traZODone (DESYREL) 150 MG tablet Take 150 mg by mouth. 03/15/14   [provider]  zolpidem (AMBIEN) 10 MG tablet  06/21/15   [provider]    Family History Family History  Problem Relation Age of Onset   Alcohol abuse Father    Heart disease Father    Dementia Father    Cancer Mother        breast and uterine    Social History Social History   Tobacco Use   Smoking status: Never   Smokeless tobacco: Never  Vaping Use   Vaping Use: Never used  Substance Use Topics   Alcohol use: No    Alcohol/week: 0.0 standard drinks of alcohol   Drug use: No     Allergies   Duloxetine, Levetiracetam, Levofloxacin, Other, and Ceftriaxone   Review of Systems Review of Systems  Constitutional:  Negative for fever.  Musculoskeletal:  Positive for arthralgias, joint  swelling and myalgias.  Skin:  Negative for color change.  Neurological:  Positive for numbness.  Hematological: Negative.   Psychiatric/Behavioral: Negative.       Physical Exam Triage Vital Signs ED Triage Vitals  Enc Vitals Group     BP      Pulse      Resp      Temp      Temp src      SpO2      Weight      Height      Head Circumference      Peak Flow      Pain Score  Pain Loc      Pain Edu?      Excl. in GC?    No data found.  Updated Vital Signs BP 114/72 (BP Location: Left Arm)   Pulse 62   Temp 98.2 F (36.8 C) (Oral)   Resp 14   Ht 5\' 4"  (1.626 m)   Wt 140 lb (63.5 kg)   SpO2 100%   BMI 24.03 kg/m   Visual Acuity Right Eye Distance:   Left Eye Distance:   Bilateral Distance:    Right Eye Near:   Left Eye Near:    Bilateral Near:     Physical Exam Vitals and nursing note reviewed.  Constitutional:      Appearance: Normal appearance. She is not ill-appearing.  HENT:     Head: Normocephalic and atraumatic.  Musculoskeletal:        General: Swelling, tenderness and signs of injury present. No deformity. Normal range of motion.  Skin:    General: Skin is warm and dry.     Capillary Refill: Capillary refill takes 2 to 3 seconds.     Findings: No bruising or erythema.  Neurological:     General: No focal deficit present.     Mental Status: She is alert and oriented to person, place, and time.     Sensory: No sensory deficit.     Motor: No weakness.  Psychiatric:        Mood and Affect: Mood normal.        Behavior: Behavior normal.        Thought Content: Thought content normal.        Judgment: Judgment normal.      UC Treatments / Results  Labs (all labs ordered are listed, but only abnormal results are displayed) Labs Reviewed - No data to display  EKG   Radiology DG Foot Complete Right  Result Date: 08/10/2022 CLINICAL DATA:  Foot pain after fall. EXAM: RIGHT FOOT COMPLETE - 3+ VIEW COMPARISON:  None Available. FINDINGS:  There is no evidence of fracture or dislocation. There is no evidence of arthropathy or other focal bone abnormality. Soft tissues are unremarkable. IMPRESSION: Negative. Electronically Signed   By: 10/10/2022 M.D.   On: 08/10/2022 16:45    Procedures Procedures (including critical care time)  Medications Ordered in UC Medications - No data to display  Initial Impression / Assessment and Plan / UC Course  I have reviewed the triage vital signs and the nursing notes.  Pertinent labs & imaging results that were available during my care of the patient were reviewed by me and considered in my medical decision making (see chart for details).   Patient is a 46 year old female with a significant past medical history to include anxiety, chronic pain, complex regional pain syndrome type II in the upper extremity, hereditary and idiopathic neuropathy, joint aches, Lyme borreliosis, RA involving multiple joints, small fiber neuropathy, and POTS presenting for evaluation of pain and swelling in her right foot after suffering a fall from a standing position 2 days ago.  Patient has a history of frequent syncopal events secondary to her POTS.  She is also on a significant number of hypnotics and narcotics prescribed by a Dr. 50, and anesthesiologist from [facility] who runs a ketamine infusion clinic.  The patient is currently prescribed alprazolam 1 mg, hydromorphone 8 mg, MS Contin 60 mg every 12 hours, oxycodone 20 mg tablets 4 times daily, diazepam 5 mg, tizanidine 4-8 mg, and promethazine 25  mg.  On exam, patient has marked edema of her right foot.      There is no appreciable ecchymosis or erythema.  She states that she has normal sensation in her toes and she is able to move them independently.  She also has full range of motion of her ankle with pain.  There is pain with palpation of the metatarsals and bones of the midfoot, arch, calcaneus, medial lateral malleolus, Achilles, and right  lower leg.  Her DP and PT pulses are 2+.  Her cap refill is approximately 3 seconds.  I will obtain radiograph of her right foot to rule out bony abnormality.  Right foot x-rays independently reviewed and evaluated by me.  Impression: No evidence of fracture or dislocation.  There is soft tissue swelling present.  Radiology overread is pending. Radiology impression states that there is no evidence of fracture or dislocation and no evidence of arthropathy or focal bone abnormality.  Negative exam.  I will discharge patient home with a diagnosis of contusion of her right foot.  She is on significant amount of pain medicine at present I do not feel comfortable prescribing anything in addition patient can use over-the-counter ibuprofen according to the package instructions as needed for pain as well as apply ice or moist heat to her foot to help with pain or swelling.  If this does not improve her pain then she she will have to go to the ER for pain management.  Patient is requesting crutches at discharge.  I advised her that I do not think crutches are a good idea as she has a history of multiple episodes of syncope and I think she may become more off balance, unstable, improved injury using crutches.  I will discharge her home with a walker.   Final Clinical Impressions(s) / UC Diagnoses   Final diagnoses:  Contusion of right foot, initial encounter     Discharge Instructions      Your x-rays today did not show the presence of any broken bones or dislocated bones.  I believe your swelling is all soft tissue related.  In addition to the pain medication that you already prescribed you can use over-the-counter ibuprofen according to the package instructions as needed for pain and inflammation.  Keep your right foot elevated is much as possible to help decrease swelling and aid in pain relief.  You can apply moist heat to your foot for 20 minutes at a time 2-3 times a day to aid in resolution  of swelling.  I also recommend wearing your compression hose to help resolve the swelling.  If the swelling continues, or your pain intensifies, I recommend following up with orthopedics or seeking treatment in the ER for pain control.     ED Prescriptions   None    PDMP not reviewed this encounter.   Margarette Canada, NP 08/10/22 1651    Margarette Canada, NP 08/10/22 1655    Margarette Canada, NP 08/10/22 1704

## 2022-10-04 ENCOUNTER — Ambulatory Visit
Admission: EM | Admit: 2022-10-04 | Discharge: 2022-10-04 | Disposition: A | Discharge status: Home / Self Care | Payer: BC Managed Care – PPO | Attending: Urgent Care | Admitting: Urgent Care

## 2022-10-04 DIAGNOSIS — L309 Dermatitis, unspecified: Secondary | ICD-10-CM | POA: Diagnosis not present

## 2022-10-04 DIAGNOSIS — L509 Urticaria, unspecified: Secondary | ICD-10-CM | POA: Insufficient documentation

## 2022-10-04 MED ORDER — CLOTRIMAZOLE 1 % EX CREA: TOPICAL_CREAM | CUTANEOUS | 0 refills | Status: AC

## 2022-10-04 MED ORDER — PREDNISONE 10 MG (21) PO TBPK: ORAL_TABLET | Freq: Every day | ORAL | 0 refills | Status: AC

## 2022-10-04 MED ORDER — TRIAMCINOLONE ACETONIDE 0.5 % EX OINT: 1.0000 | TOPICAL_OINTMENT | Freq: Two times a day (BID) | CUTANEOUS | 0 refills | Status: AC

## 2022-10-04 NOTE — ED Triage Notes (Signed)
Pt has taken Benadryl, Desaten, Milk of Magnesia, and calamine lotion for the rash.

## 2022-10-04 NOTE — ED Triage Notes (Signed)
Pt c/o Full body rash x10 days  Pt states she developed the rash 2 days after a dental exam along her face that got larger as the days prgoressed.  Pt states that the rash has gone to her vaginal area.  Pt states that she has Full body CRPS  Pt states that water burns

## 2022-10-04 NOTE — Discharge Instructions (Addendum)
Your rash appears to be some form of dermatitis. Because of the widespread nature, please take the oral prednisone taper pack as prescribed.  Please take all 12 days worth.  This will help prevent rebound dermatitis.  Apply the Lotrimin to the areas on your lower back.  Do this twice daily until resolved.  To the other areas, please apply the triamcinolone ointment.  Do not use on your face greater than 5 days, do not use greater than 14 days elsewhere.  Rub in Foley, do not occlude with a bandage.  For your hands, please purchase cotton gloves and wear them at nighttime.  Coat your hands and either a thick emollient ointment such as Vaseline, Aquaphor, or purchase over-the-counter [employer] working hands.  Follow-up with dermatology or your PCP should symptoms persist

## 2022-10-04 NOTE — ED Provider Notes (Signed)
[facility]    CSN: [id number] Arrival date & time: 10/04/22  1549      History   Chief Complaint Chief Complaint  Patient presents with   Rash    HPI Tina Donovan is a 46 y.o. female.   46 year old female presents today due to concerns of a rash over the past 10 days.  States it started 2 days after going to the dentist, however she was not prescribed any new medications at that time.  States she just had a routine cleaning.  Reports that the rash started primarily to her face, bilateral cheeks and jaw to the corner of her mouth.  States it is red and raised, intensely pruritic.  Reports however it is actually generalized at this time.  All areas are dry red and raised.  She has been using numerous types of over-the-counter creams and home remedies, and states the majority of them have decreased in redness and inflammation, but continue to be intensely pruritic.  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  She also shows me pictures of some type of "might", that she states are about the size of a poppyseed.  She reports they appear to be living in her carpet, and she has caught numerous ones on a glue trap.  She is uncertain if this is the cause.  She lives with her husband, states he is unaffected.  Regarding her blood pressure, patient states it low very frequently.  Reports a history of autonomic dysfunction.  Follows with cardiology and is on propranolol for this she states.  She also takes chronic pain medication, but does not believe her blood pressure is related to this.  In our office, she is denying any symptoms of lightheadedness, dizziness, palpitations, or any side effects from her blood pressure.  She states she has had her systolic reading in the 40s in the past and this is "nothing new".   Rash   Past Medical History:  Diagnosis Date   Anxiety    Arthritis    Rheumatoid   Bartonella infection     Chronic kidney disease    Chronic pain    CRPS   Chronic pancreatitis (HCC)    Complication of anesthesia    Pt wakes up during procedures   Constipation    Depression    GERD (gastroesophageal reflux disease)    Headache    migraines - 2-3x/mo   Hypertension    Kidney stone    Post-Lyme disease syndrome    Pyelonephritis    Rectal prolapse    Small fiber neuropathy     Patient Active Problem List   Diagnosis Date Noted   Hives 10/04/2022   Dysuria 07/25/2021   Leukorrhea 07/25/2021   Rash and other nonspecific skin eruption 01/03/2020   Urinary retention 01/03/2020   GERD without esophagitis 06/25/2018   Panic attacks 06/25/2018   Dysphagia 08/23/2017   Pelvic floor dysfunction 12/04/2016   Raynaud's phenomenon (by history or observed) 12/04/2016   Near syncope 09/06/2016   Syncope and collapse 09/06/2016   Nausea    Right ovarian cyst 08/03/2016   Chronic constipation 03/14/2016   Chronic pain 03/14/2016   Chronic pancreatitis (HCC) 03/14/2016   Complex regional pain syndrome type 2 of upper extremity 03/14/2016   Personal history of urinary infection 03/14/2016   Calculus of kidney 03/14/2016   Sequelae of other specified infectious and parasitic diseases 03/14/2016   Procidentia of  rectum 03/14/2016   Shoulder-hand syndrome 03/14/2016   Rheumatoid arthritis involving multiple joints (HCC) 03/14/2016   Small fiber neuropathy 03/14/2016   Abdominal pain, epigastric 07/14/2014   Hereditary and idiopathic neuropathy 05/28/2014   H/O urinary tract infection 05/28/2014   Infectious or parasitic disease, late effect 05/28/2014   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   Ache in joint 10/20/2013   Complex regional pain syndrome 03/12/2013   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   Avitaminosis D 01/07/2012   Anxiety 11/08/2011   Clinical depression 11/08/2011   Sphincter of Oddi dysfunction 05/18/2006    Past Surgical History:  Procedure Laterality Date   CHOLECYSTECTOMY  2005   [facility]    COLONOSCOPY  1998   CYSTOSCOPY W/ SPINCTEROTOMY  2007   ESOPHAGOGASTRODUODENOSCOPY (EGD) WITH PROPOFOL N/A 08/06/2016   Procedure: ESOPHAGOGASTRODUODENOSCOPY (EGD) WITH PROPOFOL;  Surgeon: Midge Minium, MD;  Location: [facility];  Service: Endoscopy;  Laterality: N/A;   ESOPHAGOGASTRODUODENOSCOPY ENDOSCOPY  2005   2007, 2009 and 2016   KNEE SURGERY  1998   PORTACATH PLACEMENT  2005   SPINAL CORD STIMULATOR INSERTION  2009   2017 - currently turned off - pt not using    OB History   No obstetric history on file.      Home Medications    Prior to Admission medications   Medication Sig Start Date End Date Taking? Authorizing Provider  ALPRAZolam Prudy Feeler) 1 MG tablet 1 tablet. 03/11/16  Yes [provider]  Ascorbic Acid (VITAMIN C) 100 MG tablet Take 100 mg by mouth daily.   Yes [provider]  aspirin-acetaminophen-caffeine (EXCEDRIN MIGRAINE) 216 [id number] 4875 MG tablet Take by mouth.   Yes [provider]  clotrimazole (LOTRIMIN) 1 % cream Apply to affected area 2 times daily until resolved, back rash only 10/04/22  Yes Jailyn Langhorst L, PA  DEXILANT 60 MG capsule TAKE 1 CAPSULE (60 MG TOTAL) BY MOUTH DAILY. 05/13/17  Yes Wohl, Darren, MD  diazepam (VALIUM) 5 MG tablet Take 5 mg by mouth.   Yes [provider]  diphenhydrAMINE (BENADRYL) 50 MG/ML injection See admin instructions. 10/06/16  Yes [provider]  Emollient (COCONUT OIL BEAUTY) CREA Apply topically.   Yes [provider]  HYDROmorphone (DILAUDID) 8 MG tablet Take 8 mg by mouth. 03/10/15  Yes [provider]  levonorgestrel (MIRENA) 20 MCG/24HR IUD 1 each by Intrauterine route once.   Yes [provider]  linaclotide Karlene Einstein) 290 MCG CAPS capsule Take 1 capsule (290 mcg total) by mouth daily before breakfast. 03/21/17  Yes Midge Minium, MD  magnesium hydroxide (MILK OF MAGNESIA) 400 MG/5ML suspension Take by mouth.   Yes [provider]   morphine (MS CONTIN) 60 MG 12 hr tablet Take 1 tablet by mouth. 02/21/16  Yes [provider]  nitroGLYCERIN (NITROSTAT) 0.3 MG SL tablet 0.3 mg. 01/07/12  Yes [provider]  ondansetron (ZOFRAN) 8 MG tablet TAKE 1 TABLET UP TO 3 TIMES DAILY AS NEEDED 06/12/16  Yes [provider]  ondansetron (ZOFRAN-ODT) 8 MG disintegrating tablet 8 mg.   Yes [provider]  Oxycodone HCl 20 MG TABS Take 1 tablet by mouth. 03/04/16  Yes [provider]  polyethylene glycol (MIRALAX / GLYCOLAX) packet Take 17 g by mouth daily.   Yes [provider]  predniSONE (STERAPRED UNI-PAK 21 TAB) 10 MG (21) TBPK tablet Take by mouth daily. Take 6 tabs by mouth daily  for 2 days, then 5 tabs for 2 days, then 4 tabs for 2 days,  then 3 tabs for 2 days, 2 tabs for 2 days, then 1 tab by mouth daily for 2 days 10/04/22  Yes Briggs Edelen L, PA  promethazine (PHENERGAN) 25 MG tablet Take 25 mg by mouth 2 (two) times daily as needed. 01/10/16  Yes [provider]  propranolol ER (INDERAL LA) 60 MG 24 hr capsule  09/11/15  Yes [provider]  tiZANidine (ZANAFLEX) 4 MG tablet Take 4-8 mg by mouth. 12/28/14  Yes [provider]  traZODone (DESYREL) 150 MG tablet Take 150 mg by mouth. 03/15/14  Yes [provider]  triamcinolone ointment (KENALOG) 0.5 % Apply 1 Application topically 2 (two) times daily. Do not occlude, do not use >14 days consecutively 10/04/22  Yes Savvas Roper L, PA    Family History Family History  Problem Relation Age of Onset   Alcohol abuse Father    Heart disease Father    Dementia Father    Cancer Mother        breast and uterine    Social History Social History   Tobacco Use   Smoking status: Never   Smokeless tobacco: Never  Vaping Use   Vaping Use: Never used  Substance Use Topics   Alcohol use: No    Alcohol/week: 0.0 standard drinks of alcohol   Drug use: No     Allergies   Duloxetine,  Levetiracetam, Levofloxacin, Other, and Ceftriaxone   Review of Systems Review of Systems  Skin:  Positive for rash.  As per HPI   Physical Exam Triage Vital Signs ED Triage Vitals  Enc Vitals Group     BP 10/04/22 1626 (!) 80/59     Pulse Rate 10/04/22 1626 72     Resp 10/04/22 1626 18     Temp 10/04/22 1626 98.2 F (36.8 C)     Temp Source 10/04/22 1624 Oral     SpO2 10/04/22 1626 100 %     Weight 10/04/22 1621 123 lb (55.8 kg)     Height 10/04/22 1621 5\' 4"  (1.626 m)     Head Circumference --      Peak Flow --      Pain Score 10/04/22 1617 5     Pain Loc --      Pain Edu? --      Excl. in GC? --    No data found.  Updated Vital Signs BP (S) (!) 81/49 (BP Location: Left Arm)   Pulse 66   Temp 98.2 F (36.8 C) (Oral)   Resp 16   Ht 5\' 4"  (1.626 m)   Wt 123 lb (55.8 kg)   SpO2 100%   BMI 21.11 kg/m   Visual Acuity Right Eye Distance:   Left Eye Distance:   Bilateral Distance:    Right Eye Near:   Left Eye Near:    Bilateral Near:     Physical Exam Vitals and nursing note reviewed.  Constitutional:      General: She is not in acute distress.    Appearance: Normal appearance. She is not ill-appearing, toxic-appearing or diaphoretic.     Comments: Thin appearing body habitus  Cardiovascular:     Rate and Rhythm: Normal rate.     Comments: Implantable device to upper L chest Pulmonary:     Effort: Pulmonary effort is normal. No respiratory distress.  Skin:    General: Skin is warm and dry.     Capillary Refill: Capillary refill takes less than 2 seconds.     Coloration:  Skin is not jaundiced.     Findings: Rash (widespread rash, generalized across body. Rash is primarily erythematous, raised in patches. Some areas are scaling, others are papular.) present. No bruising.     Comments: Bilateral hands, R>L lichenified to the MCP joints, dry, cracking and fissured. Facial rash is located to bilateral cheeks, under zygomatic bone and extending to corners of  mouth. Significant improvement noted compared to pictures shown from 10 days prior. Patients bilateral upper and lower extremities show extensive linear scars from prior cuts  Neurological:     Mental Status: She is alert.      UC Treatments / Results  Labs (all labs ordered are listed, but only abnormal results are displayed) Labs Reviewed - No data to display  EKG   Radiology No results found.  Procedures Procedures (including critical care time)  Medications Ordered in UC Medications - No data to display  Initial Impression / Assessment and Plan / UC Course  I have reviewed the triage vital signs and the nursing notes.  Pertinent labs & imaging results that were available during my care of the patient were reviewed by me and considered in my medical decision making (see chart for details).     Dermatitis -cause unknown.  Started in the area of her face in which the gloves would have touched during her dental procedure, however it is now widespread across her body.  It has responded moderately to the topical treatments she has been trying.  It appears most consistent with some type of dermatitis, and will therefore do topical and p.o. steroids.  Will do 12-day course to prevent rebound dermatitis and help with the itching.  Will do topical ointment triamcinolone to help with the dry skin and provide hydration.  There are 3 locations on patient's lumbar region which look possibly consistent with tinea corporis.  Will do topical Lotrisone to that location only.  For patient's hands, I recommended she use Aquaphor at night, or possibly O'[employer]eefe's working hands.  Patient must follow-up with dermatology or PCP should her symptoms fail to respond. Low blood pressure -patient follows with both neurology and cardiology.  She takes numerous pain medications which may contribute to this.  Patient also takes propranolol.  She states this is not abnormal for her, has had full workups, and  currently is denying any symptoms worrisome for hypotension. She has a driver to take home. Recommended close monitoring.    Final Clinical Impressions(s) / UC Diagnoses   Final diagnoses:  Dermatitis     Discharge Instructions      Your rash appears to be some form of dermatitis. Because of the widespread nature, please take the oral prednisone taper pack as prescribed.  Please take all 12 days worth.  This will help prevent rebound dermatitis.  Apply the Lotrimin to the areas on your lower back.  Do this twice daily until resolved.  To the other areas, please apply the triamcinolone ointment.  Do not use on your face greater than 5 days, do not use greater than 14 days elsewhere.  Rub in Foley, do not occlude with a bandage.  For your hands, please purchase cotton gloves and wear them at nighttime.  Coat your hands and either a thick emollient ointment such as Vaseline, Aquaphor, or purchase over-the-counter [employer]eefe's working hands.  Follow-up with dermatology or your PCP should symptoms persist   ED Prescriptions     Medication Sig Dispense Auth. Provider   triamcinolone ointment (KENALOG) 0.5 % Apply  1 Application topically 2 (two) times daily. Do not occlude, do not use >14 days consecutively 45 g Jasiel Apachito L, PA   predniSONE (STERAPRED UNI-PAK 21 TAB) 10 MG (21) TBPK tablet Take by mouth daily. Take 6 tabs by mouth daily  for 2 days, then 5 tabs for 2 days, then 4 tabs for 2 days, then 3 tabs for 2 days, 2 tabs for 2 days, then 1 tab by mouth daily for 2 days 42 tablet Keatyn Jawad L, PA   clotrimazole (LOTRIMIN) 1 % cream Apply to affected area 2 times daily until resolved, back rash only 30 g Leasha Goldberger L, PA      PDMP not reviewed this encounter.   Maretta Bees, [place] 10/04/22 1726

## 2023-01-16 ENCOUNTER — Telehealth: Payer: Self-pay

## 2023-01-16 NOTE — Telephone Encounter (Signed)
Patient called and left a voicemail requesting a call back. Returned patient call and she said she is needing to schedule a appointment with Dr. Allen Norris. Informed patient she needed a referral from her PCP or another provider referring her to our office and then we can schedule a appointment. She stated she has been seen in the past. Informed patient that been over 5 years ago and she is a new patient now so she would need a new referral.
